# Patient Record
Sex: Male | Born: 1970 | Hispanic: No | Smoking: Current every day smoker
Health system: Southern US, Community
[De-identification: ages and names within clinical notes are randomized; demographics above are authoritative.]

## PROBLEM LIST (undated history)

## (undated) DIAGNOSIS — A4902 Methicillin resistant Staphylococcus aureus infection, unspecified site: Secondary | ICD-10-CM

---

## 2010-03-14 ENCOUNTER — Emergency Department (HOSPITAL_COMMUNITY)
Admission: EM | Admit: 2010-03-14 | Discharge: 2010-03-14 | Payer: Self-pay | Source: Home / Self Care | Admitting: Emergency Medicine

## 2010-06-04 ENCOUNTER — Emergency Department (HOSPITAL_COMMUNITY): Payer: Worker's Compensation

## 2010-06-04 ENCOUNTER — Emergency Department (HOSPITAL_COMMUNITY)
Admission: EM | Admit: 2010-06-04 | Discharge: 2010-06-04 | Disposition: A | Discharge status: Home / Self Care | Payer: Worker's Compensation | Attending: Emergency Medicine | Admitting: Emergency Medicine

## 2010-06-04 DIAGNOSIS — S0990XA Unspecified injury of head, initial encounter: Secondary | ICD-10-CM | POA: Insufficient documentation

## 2010-06-04 DIAGNOSIS — W208XXA Other cause of strike by thrown, projected or falling object, initial encounter: Secondary | ICD-10-CM | POA: Insufficient documentation

## 2010-06-04 DIAGNOSIS — Y990 Civilian activity done for income or pay: Secondary | ICD-10-CM | POA: Insufficient documentation

## 2010-06-04 DIAGNOSIS — R51 Headache: Secondary | ICD-10-CM | POA: Insufficient documentation

## 2010-06-04 DIAGNOSIS — M542 Cervicalgia: Secondary | ICD-10-CM | POA: Insufficient documentation

## 2011-04-16 ENCOUNTER — Emergency Department (HOSPITAL_COMMUNITY): Payer: Self-pay

## 2011-04-16 ENCOUNTER — Emergency Department (HOSPITAL_COMMUNITY)
Admission: EM | Admit: 2011-04-16 | Discharge: 2011-04-16 | Disposition: A | Discharge status: Home / Self Care | Payer: Self-pay | Attending: Emergency Medicine | Admitting: Emergency Medicine

## 2011-04-16 ENCOUNTER — Encounter (HOSPITAL_COMMUNITY): Payer: Self-pay | Admitting: Emergency Medicine

## 2011-04-16 DIAGNOSIS — M109 Gout, unspecified: Secondary | ICD-10-CM | POA: Insufficient documentation

## 2011-04-16 DIAGNOSIS — M7989 Other specified soft tissue disorders: Secondary | ICD-10-CM | POA: Insufficient documentation

## 2011-04-16 DIAGNOSIS — M79609 Pain in unspecified limb: Secondary | ICD-10-CM | POA: Insufficient documentation

## 2011-04-16 MED ORDER — INDOMETHACIN 25 MG PO CAPS: 25.0000 mg | ORAL_CAPSULE | Freq: Three times a day (TID) | ORAL | Status: DC | PRN

## 2011-04-16 MED ORDER — OXYCODONE-ACETAMINOPHEN 5-325 MG PO TABS: 1.0000 | ORAL_TABLET | Freq: Four times a day (QID) | ORAL | Status: AC | PRN

## 2011-04-16 MED ORDER — OXYCODONE-ACETAMINOPHEN 5-325 MG PO TABS: 1.0000 | ORAL_TABLET | Freq: Four times a day (QID) | ORAL | Status: DC | PRN

## 2011-04-16 MED ORDER — KETOROLAC TROMETHAMINE 60 MG/2ML IM SOLN
60.0000 mg | Freq: Once | INTRAMUSCULAR | Status: AC
  Administered 2011-04-16: 60 mg via INTRAMUSCULAR
  Filled 2011-04-16: qty 2

## 2011-04-16 NOTE — ED Notes (Signed)
Middle Finger of l/hand increased in swelling and tenderness over 3 days

## 2011-04-16 NOTE — Discharge Instructions (Signed)
Gout Gout is an inflammatory condition (arthritis) caused by a buildup of uric acid crystals in the joints. Uric acid is a chemical that is normally present in the blood. Under some circumstances, uric acid can form into crystals in your joints. This causes joint redness, soreness, and swelling (inflammation). Repeat attacks are common. Over time, uric acid crystals can form into masses (tophi) near a joint, causing disfigurement. Gout is treatable and often preventable. CAUSES  The disease begins with elevated levels of uric acid in the blood. Uric acid is produced by your body when it breaks down a naturally found substance called purines. This also happens when you eat certain foods such as meats and fish. Causes of an elevated uric acid level include:  Being passed down from parent to child (heredity).   Diseases that cause increased uric acid production (obesity, psoriasis, some cancers).   Excessive alcohol use.   Diet, especially diets rich in meat and seafood.   Medicines, including certain cancer-fighting drugs (chemotherapy), diuretics, and aspirin.   Chronic kidney disease. The kidneys are no longer able to remove uric acid well.   Problems with metabolism.  Conditions strongly associated with gout include:  Obesity.   High blood pressure.   High cholesterol.   Diabetes.  Not everyone with elevated uric acid levels gets gout. It is not understood why some people get gout and others do not. Surgery, joint injury, and eating too much of certain foods are some of the factors that can lead to gout. SYMPTOMS   An attack of gout comes on quickly. It causes intense pain with redness, swelling, and warmth in a joint.   Fever can occur.   Often, only one joint is involved. Certain joints are more commonly involved:   Base of the big toe.   Knee.   Ankle.   Wrist.   Finger.  Without treatment, an attack usually goes away in a few days to weeks. Between attacks, you  usually will not have symptoms, which is different from many other forms of arthritis. DIAGNOSIS  Your caregiver will suspect gout based on your symptoms and exam. Removal of fluid from the joint (arthrocentesis) is done to check for uric acid crystals. Your caregiver will give you a medicine that numbs the area (local anesthetic) and use a needle to remove joint fluid for exam. Gout is confirmed when uric acid crystals are seen in joint fluid, using a special microscope. Sometimes, blood, urine, and X-ray tests are also used. TREATMENT  There are 2 phases to gout treatment: treating the sudden onset (acute) attack and preventing attacks (prophylaxis). Treatment of an Acute Attack  Medicines are used. These include anti-inflammatory medicines or steroid medicines.   An injection of steroid medicine into the affected joint is sometimes necessary.   The painful joint is rested. Movement can worsen the arthritis.   You may use warm or cold treatments on painful joints, depending which works best for you.   Discuss the use of coffee, vitamin C, or cherries with your caregiver. These may be helpful treatment options.  Treatment to Prevent Attacks After the acute attack subsides, your caregiver may advise prophylactic medicine. These medicines either help your kidneys eliminate uric acid from your body or decrease your uric acid production. You may need to stay on these medicines for a very long time. The early phase of treatment with prophylactic medicine can be associated with an increase in acute gout attacks. For this reason, during the first few months   of treatment, your caregiver may also advise you to take medicines usually used for acute gout treatment. Be sure you understand your caregiver's directions. You should also discuss dietary treatment with your caregiver. Certain foods such as meats and fish can increase uric acid levels. Other foods such as dairy can decrease levels. Your caregiver  can give you a list of foods to avoid. HOME CARE INSTRUCTIONS   Do not take aspirin to relieve pain. This raises uric acid levels.   Only take over-the-counter or prescription medicines for pain, discomfort, or fever as directed by your caregiver.   Rest the joint as much as possible. When in bed, keep sheets and blankets off painful areas.   Keep the affected joint raised (elevated).   Use crutches if the painful joint is in your leg.   Drink enough water and fluids to keep your urine clear or pale yellow. This helps your body get rid of uric acid. Do not drink alcoholic beverages. They slow the passage of uric acid.   Follow your caregiver's dietary instructions. Pay careful attention to the amount of protein you eat. Your daily diet should emphasize fruits, vegetables, whole grains, and fat-free or low-fat milk products.   Maintain a healthy body weight.  SEEK MEDICAL CARE IF:   You have an oral temperature above 102 F (38.9 C).   You develop diarrhea, vomiting, or any side effects from medicines.   You do not feel better in 24 hours, or you are getting worse.  SEEK IMMEDIATE MEDICAL CARE IF:   Your joint becomes suddenly more tender and you have:   Chills.   An oral temperature above 102 F (38.9 C), not controlled by medicine.  MAKE SURE YOU:   Understand these instructions.   Will watch your condition.   Will get help right away if you are not doing well or get worse.  Document Released: 01/30/2000 Document Revised: 10/14/2010 Document Reviewed: 05/12/2009 [employer] Patient Information 2012 [employer]. 

## 2011-04-16 NOTE — ED Provider Notes (Signed)
History     CSN: [id number]  Arrival date & time 04/16/11  1005   First MD Initiated Contact with Patient 04/16/11 1031      Chief Complaint  Patient presents with  . Hand Pain    l/hand-middle finger painful and swollen x 3 days. Occ. itching. denies trauma    (Consider location/radiation/quality/duration/timing/severity/associated sxs/prior treatment) HPI  History presents to the emergency department with complaints of his knuckle on the middle finger being painful. He denies injury, denies history of gout, denies history of the same. He states that it started 3 days ago and progressively got worse. He denies having pain like this before in his life.pt denies having significant PMH  or fevers, vomiting, weakness.   History reviewed. No pertinent past medical history.  History reviewed. No pertinent past surgical history.  Family History  Problem Relation Age of Onset  . Diabetes Mother   . Hypertension Mother     History  Substance Use Topics  . Smoking status: Current Everyday Smoker -- 15 years    Types: Cigarettes  . Smokeless tobacco: Not on file  . Alcohol Use: Yes      Review of Systems  All other systems reviewed and are negative.    Allergies  Review of patient's allergies indicates no known allergies.  Home Medications   Current Outpatient Rx  Name Route Sig Dispense Refill  . INDOMETHACIN 25 MG PO CAPS Oral Take 1 capsule (25 mg total) by mouth 3 (three) times daily as needed. 30 capsule 0  . OXYCODONE-ACETAMINOPHEN 5-325 MG PO TABS Oral Take 1 tablet by mouth every 6 (six) hours as needed for pain. 15 tablet 0    BP 142/86  Pulse 70  Temp(Src) 98.3 F (36.8 C) (Oral)  Resp 16  Wt 200 lb (90.719 kg)  SpO2 98%  Physical Exam  Nursing note and vitals reviewed. Constitutional: He appears well-developed and well-nourished. No distress.  HENT:  Head: Normocephalic and atraumatic.  Eyes: Pupils are equal, round, and reactive to light.  Neck:  Normal range of motion. Neck supple.  Cardiovascular: Normal rate and regular rhythm.   Pulmonary/Chest: Effort normal.  Abdominal: Soft.  Musculoskeletal:       Left hand: He exhibits tenderness. He exhibits normal range of motion, no bony tenderness, normal two-point discrimination, normal capillary refill, no deformity, no laceration and no swelling. normal sensation noted. Normal strength noted.       Hands: Neurological: He is alert.  Skin: Skin is warm and dry.    ED Course  Procedures (including critical care time)  Labs Reviewed - No data to display Dg Hand Complete Left  04/16/2011  *RADIOLOGY REPORT*  Clinical Data: Hand pain.  Pain predominantly involves the distal third finger.  LEFT HAND - COMPLETE 3+ VIEW  Comparison: None.  Findings: Imaged bones, joints and soft tissues appear normal.  IMPRESSION: Negative exam.  Original Report Authenticated By: Bernadene Bell. Maricela Curet, M.D.     1. Gout       MDM  Pt has no history of gout, however his symptoms are consistent for gout. Will treat with Percocet and Indomethacin and refer patient to his PCP.         Dorthula Matas, PA 04/16/11 1125

## 2011-04-18 NOTE — ED Provider Notes (Signed)
Medical screening examination/treatment/procedure(s) were performed by non-physician practitioner and as supervising physician I was immediately available for consultation/collaboration.   Hurman Horn, MD 04/18/11 [phone]

## 2011-04-19 ENCOUNTER — Emergency Department (HOSPITAL_COMMUNITY)
Admission: EM | Admit: 2011-04-19 | Discharge: 2011-04-19 | Disposition: A | Discharge status: Home / Self Care | Payer: Worker's Compensation | Attending: Emergency Medicine | Admitting: Emergency Medicine

## 2011-04-19 ENCOUNTER — Ambulatory Visit (HOSPITAL_BASED_OUTPATIENT_CLINIC_OR_DEPARTMENT_OTHER): Payer: Worker's Compensation | Admitting: Anesthesiology

## 2011-04-19 ENCOUNTER — Emergency Department (HOSPITAL_COMMUNITY): Payer: Worker's Compensation

## 2011-04-19 ENCOUNTER — Encounter (HOSPITAL_BASED_OUTPATIENT_CLINIC_OR_DEPARTMENT_OTHER): Payer: Self-pay | Admitting: Orthopedic Surgery

## 2011-04-19 ENCOUNTER — Encounter (HOSPITAL_BASED_OUTPATIENT_CLINIC_OR_DEPARTMENT_OTHER)
Admission: RE | Disposition: A | Discharge status: Home / Self Care | Payer: Self-pay | Source: Ambulatory Visit | Attending: Orthopedic Surgery

## 2011-04-19 ENCOUNTER — Encounter (HOSPITAL_BASED_OUTPATIENT_CLINIC_OR_DEPARTMENT_OTHER): Payer: Self-pay | Admitting: Anesthesiology

## 2011-04-19 ENCOUNTER — Encounter (HOSPITAL_BASED_OUTPATIENT_CLINIC_OR_DEPARTMENT_OTHER): Payer: Self-pay

## 2011-04-19 ENCOUNTER — Encounter (HOSPITAL_COMMUNITY): Payer: Self-pay | Admitting: *Deleted

## 2011-04-19 ENCOUNTER — Ambulatory Visit (HOSPITAL_BASED_OUTPATIENT_CLINIC_OR_DEPARTMENT_OTHER)
Admission: RE | Admit: 2011-04-19 | Discharge: 2011-04-19 | Disposition: A | Discharge status: Home / Self Care | Payer: Worker's Compensation | Source: Ambulatory Visit | Attending: Orthopedic Surgery | Admitting: Orthopedic Surgery

## 2011-04-19 DIAGNOSIS — L089 Local infection of the skin and subcutaneous tissue, unspecified: Secondary | ICD-10-CM | POA: Insufficient documentation

## 2011-04-19 DIAGNOSIS — IMO0002 Reserved for concepts with insufficient information to code with codable children: Secondary | ICD-10-CM

## 2011-04-19 DIAGNOSIS — M7989 Other specified soft tissue disorders: Secondary | ICD-10-CM | POA: Insufficient documentation

## 2011-04-19 HISTORY — PX: I & D EXTREMITY: SHX5045

## 2011-04-19 LAB — DIFFERENTIAL
Basophils Absolute: 0 10*3/uL (ref 0.0–0.1)
Basophils Relative: 0 % (ref 0–1)
Eosinophils Absolute: 0.1 10*3/uL (ref 0.0–0.7)
Eosinophils Relative: 1 % (ref 0–5)
Monocytes Absolute: 0.7 10*3/uL (ref 0.1–1.0)
Neutro Abs: 6.4 10*3/uL (ref 1.7–7.7)

## 2011-04-19 LAB — SEDIMENTATION RATE: Sed Rate: 26 mm/hr — ABNORMAL HIGH (ref 0–16)

## 2011-04-19 LAB — CBC
HCT: 41.9 % (ref 39.0–52.0)
MCH: 29.9 pg (ref 26.0–34.0)
MCHC: 35.1 g/dL (ref 30.0–36.0)
RDW: 14.1 % (ref 11.5–15.5)

## 2011-04-19 SURGERY — IRRIGATION AND DEBRIDEMENT EXTREMITY
Anesthesia: General | Site: Finger | Laterality: Left | Wound class: Dirty or Infected

## 2011-04-19 MED ORDER — METOCLOPRAMIDE HCL 5 MG/ML IJ SOLN: 10.0000 mg | Freq: Once | INTRAMUSCULAR | Status: DC | PRN

## 2011-04-19 MED ORDER — ROPIVACAINE HCL 5 MG/ML IJ SOLN
INTRAMUSCULAR | Status: DC | PRN
  Administered 2011-04-19: 30 mL via EPIDURAL

## 2011-04-19 MED ORDER — SODIUM CHLORIDE 0.9 % IV SOLN
3.0000 g | Freq: Once | INTRAVENOUS | Status: AC
  Administered 2011-04-19: 3 g via INTRAVENOUS
  Filled 2011-04-19: qty 3

## 2011-04-19 MED ORDER — LIDOCAINE HCL 1 % IJ SOLN
INTRAMUSCULAR | Status: DC | PRN
  Administered 2011-04-19: 2 mL via INTRADERMAL

## 2011-04-19 MED ORDER — PROPOFOL 10 MG/ML IV EMUL
INTRAVENOUS | Status: DC | PRN
  Administered 2011-04-19: 250 mg via INTRAVENOUS

## 2011-04-19 MED ORDER — FENTANYL CITRATE 0.05 MG/ML IJ SOLN
50.0000 ug | INTRAMUSCULAR | Status: DC | PRN
  Administered 2011-04-19: 100 ug via INTRAVENOUS

## 2011-04-19 MED ORDER — SULFAMETHOXAZOLE-TRIMETHOPRIM 800-160 MG PO TABS: 1.0000 | ORAL_TABLET | Freq: Two times a day (BID) | ORAL | Status: AC

## 2011-04-19 MED ORDER — ONDANSETRON HCL 4 MG/2ML IJ SOLN
INTRAMUSCULAR | Status: DC | PRN
  Administered 2011-04-19: 4 mg via INTRAVENOUS

## 2011-04-19 MED ORDER — FENTANYL CITRATE 0.05 MG/ML IJ SOLN: 25.0000 ug | INTRAMUSCULAR | Status: DC | PRN

## 2011-04-19 MED ORDER — CHLORHEXIDINE GLUCONATE 4 % EX LIQD: 60.0000 mL | Freq: Once | CUTANEOUS | Status: DC

## 2011-04-19 MED ORDER — DEXAMETHASONE SODIUM PHOSPHATE 10 MG/ML IJ SOLN
INTRAMUSCULAR | Status: DC | PRN
  Administered 2011-04-19: 10 mg via INTRAVENOUS

## 2011-04-19 MED ORDER — CEFAZOLIN SODIUM 1-5 GM-% IV SOLN
INTRAVENOUS | Status: DC | PRN
  Administered 2011-04-19: 2 g via INTRAVENOUS

## 2011-04-19 MED ORDER — MORPHINE SULFATE 4 MG/ML IJ SOLN
4.0000 mg | INTRAMUSCULAR | Status: DC | PRN
  Administered 2011-04-19: 4 mg via INTRAVENOUS
  Filled 2011-04-19: qty 1

## 2011-04-19 MED ORDER — OXYCODONE-ACETAMINOPHEN 5-325 MG PO TABS: ORAL_TABLET | ORAL | Status: AC

## 2011-04-19 MED ORDER — MIDAZOLAM HCL 2 MG/2ML IJ SOLN
0.5000 mg | INTRAMUSCULAR | Status: DC | PRN
  Administered 2011-04-19: 2 mg via INTRAVENOUS

## 2011-04-19 MED ORDER — LACTATED RINGERS IV SOLN
INTRAVENOUS | Status: DC
  Administered 2011-04-19 (×2): via INTRAVENOUS

## 2011-04-19 SURGICAL SUPPLY — 51 items
BAG DECANTER FOR FLEXI CONT (MISCELLANEOUS) IMPLANT
BANDAGE ELASTIC 3 VELCRO ST LF (GAUZE/BANDAGES/DRESSINGS) IMPLANT
BANDAGE GAUZE ELAST BULKY 4 IN (GAUZE/BANDAGES/DRESSINGS) IMPLANT
BANDAGE GAUZE STRT 1 STR LF (GAUZE/BANDAGES/DRESSINGS) IMPLANT
BLADE MINI RND TIP GREEN BEAV (BLADE) IMPLANT
BLADE SURG 15 STRL LF DISP TIS (BLADE) ×2 IMPLANT
BLADE SURG 15 STRL SS (BLADE) ×2
BNDG COHESIVE 1X5 TAN STRL LF (GAUZE/BANDAGES/DRESSINGS) ×2 IMPLANT
BNDG ELASTIC 2 VLCR STRL LF (GAUZE/BANDAGES/DRESSINGS) IMPLANT
BNDG ESMARK 4X9 LF (GAUZE/BANDAGES/DRESSINGS) ×2 IMPLANT
CHLORAPREP W/TINT 26ML (MISCELLANEOUS) ×2 IMPLANT
CLOTH BEACON ORANGE TIMEOUT ST (SAFETY) ×2 IMPLANT
CORDS BIPOLAR (ELECTRODE) ×2 IMPLANT
COVER MAYO STAND STRL (DRAPES) ×2 IMPLANT
COVER TABLE BACK 60X90 (DRAPES) ×2 IMPLANT
CUFF TOURNIQUET SINGLE 18IN (TOURNIQUET CUFF) ×2 IMPLANT
DRAPE EXTREMITY T 121X128X90 (DRAPE) ×2 IMPLANT
DRAPE SURG 17X23 STRL (DRAPES) ×2 IMPLANT
GAUZE PACKING IODOFORM 1/4X5 (PACKING) ×2 IMPLANT
GAUZE SPONGE 4X4 12PLY STRL LF (GAUZE/BANDAGES/DRESSINGS) ×4 IMPLANT
GAUZE XEROFORM 1X8 LF (GAUZE/BANDAGES/DRESSINGS) ×2 IMPLANT
GLOVE BIO SURGEON STRL SZ7.5 (GLOVE) ×2 IMPLANT
GLOVE BIOGEL PI IND STRL 8 (GLOVE) ×2 IMPLANT
GLOVE BIOGEL PI INDICATOR 8 (GLOVE) ×2
GLOVE SURG SS PI 8.0 STRL IVOR (GLOVE) ×2 IMPLANT
GOWN PREVENTION PLUS XLARGE (GOWN DISPOSABLE) IMPLANT
GOWN PREVENTION PLUS XXLARGE (GOWN DISPOSABLE) ×2 IMPLANT
GOWN STRL REIN XL XLG (GOWN DISPOSABLE) ×2 IMPLANT
LOOP VESSEL MAXI BLUE (MISCELLANEOUS) IMPLANT
NEEDLE HYPO 25X1 1.5 SAFETY (NEEDLE) IMPLANT
NS IRRIG 1000ML POUR BTL (IV SOLUTION) ×2 IMPLANT
PACK BASIN DAY SURGERY FS (CUSTOM PROCEDURE TRAY) ×2 IMPLANT
PAD CAST 3X4 CTTN HI CHSV (CAST SUPPLIES) IMPLANT
PADDING CAST ABS 4INX4YD NS (CAST SUPPLIES) ×1
PADDING CAST ABS COTTON 4X4 ST (CAST SUPPLIES) ×1 IMPLANT
PADDING CAST COTTON 3X4 STRL (CAST SUPPLIES)
SPLINT FNGR PLAIN END 5/8X3.25 (CAST SUPPLIES) ×1 IMPLANT
SPLINT PLASTALUME 3 1/4 (CAST SUPPLIES) ×2
SPLINT PLASTER CAST XFAST 3X15 (CAST SUPPLIES) IMPLANT
SPLINT PLASTER XTRA FASTSET 3X (CAST SUPPLIES)
SPONGE GAUZE 4X4 12PLY (GAUZE/BANDAGES/DRESSINGS) IMPLANT
STOCKINETTE 4X48 STRL (DRAPES) ×2 IMPLANT
SUT ETHILON 4 0 PS 2 18 (SUTURE) ×2 IMPLANT
SWAB CULTURE LIQ STUART DBL (MISCELLANEOUS) ×2 IMPLANT
SYR BULB 3OZ (MISCELLANEOUS) ×2 IMPLANT
SYR CONTROL 10ML LL (SYRINGE) ×2 IMPLANT
TOWEL OR 17X24 6PK STRL BLUE (TOWEL DISPOSABLE) ×4 IMPLANT
TUBE ANAEROBIC SPECIMEN COL (MISCELLANEOUS) ×2 IMPLANT
TUBE FEEDING 5FR 15 INCH (TUBING) IMPLANT
UNDERPAD 30X30 INCONTINENT (UNDERPADS AND DIAPERS) ×2 IMPLANT
WATER STERILE IRR 1000ML POUR (IV SOLUTION) IMPLANT

## 2011-04-19 NOTE — Discharge Instructions (Addendum)
[facility] Instructions Hand Surgery  Wound Care: Keep your hand elevated above the level of your heart.  Do not allow it to dangle  by your side.  Keep the dressing dry and do not remove it unless your doctor advises you to do so.  He will usually change it at the time of your post-op visit.  Moving your fingers is advised to stimulate circulation but will depend on the site of your surgery.  If you have a splint applied, your doctor will advise you regarding movement.  Activity: Do not drive or operate machinery today.  Rest today and then you may return to your normal activity and work as indicated by your physician.  Diet:  Drink liquids today or eat a light diet.  You may resume a regular diet tomorrow.    General expectations: Pain for two to three days. Fingers may become slightly swollen.  Call your doctor if any of the following occur: Severe pain not relieved by pain medication. Elevated temperature. Dressing soaked with blood. Inability to move fingers. White or bluish color to fingers.   Regional Anesthesia Blocks  1. Numbness or the inability to move the "blocked" extremity may last from 3-48 hours after placement. The length of time depends on the medication injected and your individual response to the medication. If the numbness is not going away after 48 hours, call your surgeon.  2. The extremity that is blocked will need to be protected until the numbness is gone and the  Strength has returned. Because you cannot feel it, you will need to take extra care to avoid injury. Because it may be weak, you may have difficulty moving it or using it. You may not know what position it is in without looking at it while the block is in effect.  3. For blocks in the legs and feet, returning to weight bearing and walking needs to be done carefully. You will need to wait until the numbness is entirely gone and the strength has returned. You should be able to move your leg and foot  normally before you try and bear weight or walk. You will need someone to be with you when you first try to ensure you do not fall and possibly risk injury.  4. Bruising and tenderness at the needle site are common side effects and will resolve in a few days.  5. Persistent numbness or new problems with movement should be communicated to the surgeon or the [facility] ([phone]).    Post Anesthesia Home Care Instructions  Activity: Get plenty of rest for the remainder of the day. A responsible adult should stay with you for 24 hours following the procedure.  For the next 24 hours, DO NOT: -Drive a car -Operate machinery -Drink alcoholic beverages -Take any medication unless instructed by your physician -Make any legal decisions or sign important papers.  Meals: Start with liquid foods such as gelatin or soup. Progress to regular foods as tolerated. Avoid greasy, spicy, heavy foods. If nausea and/or vomiting occur, drink only clear liquids until the nausea and/or vomiting subsides. Call your physician if vomiting continues.  Special Instructions/Symptoms: Your throat may feel dry or sore from the anesthesia or the breathing tube placed in your throat during surgery. If this causes discomfort, gargle with warm salt water. The discomfort should disappear within 24 hours.    

## 2011-04-19 NOTE — Op Note (Signed)
Dictation [id number]

## 2011-04-19 NOTE — Anesthesia Preprocedure Evaluation (Signed)
Anesthesia Evaluation  Patient identified by MRN, date of birth, ID band Patient awake    Reviewed: Allergy & Precautions, H&P , NPO status , Patient's Chart, lab work & pertinent test results, reviewed documented beta blocker date and time   Airway Mallampati: II TM Distance: >3 FB Neck ROM: full    Dental   Pulmonary Current Smoker,          Cardiovascular negative cardio ROS      Neuro/Psych negative neurological ROS  negative psych ROS   GI/Hepatic negative GI ROS, Neg liver ROS,   Endo/Other  negative endocrine ROS  Renal/GU negative Renal ROS  negative genitourinary   Musculoskeletal   Abdominal   Peds  Hematology negative hematology ROS (+)   Anesthesia Other Findings See surgeon's H&P   Reproductive/Obstetrics negative OB ROS                           Anesthesia Physical Anesthesia Plan  ASA: II  Anesthesia Plan: General   Post-op Pain Management:    Induction: Intravenous  Airway Management Planned: LMA  Additional Equipment:   Intra-op Plan:   Post-operative Plan: Extubation in OR  Informed Consent: I have reviewed the patients History and Physical, chart, labs and discussed the procedure including the risks, benefits and alternatives for the proposed anesthesia with the patient or authorized representative who has indicated his/her understanding and acceptance.     Plan Discussed with: CRNA and Surgeon  Anesthesia Plan Comments:         Anesthesia Quick Evaluation

## 2011-04-19 NOTE — ED Provider Notes (Signed)
History     CSN: [id number]  Arrival date & time 04/19/11  0422   First MD Initiated Contact with Patient 04/19/11 [phone]      Chief Complaint  Patient presents with  . Gout    L 3rd digit  . Joint Swelling    (Consider location/radiation/quality/duration/timing/severity/associated sxs/prior treatment) HPI Comments: Patient presents emergency Department with a chief complaint of finger pain of left 3rd digit. Pt states that pain started on wed evening and continued to gradually worsen. Pt was evaluated in ED & dx w first time gout. Pt was given indomethacin & pain meds w advice to return to ED if no improvement. Pt states that his joint swelling has worsened, discoloration & warmth are spreading and that he cannot move his finger at all. Pt denies IV drug use, recent cuts or trauma to finger. 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 Pt denies pain of other fingers, wrist or elbow, fevers, night sweats or chills.  The history is provided by the patient.    History reviewed. No pertinent past medical history.  History reviewed. No pertinent past surgical history.  Family History  Problem Relation Age of Onset  . Diabetes Mother   . Hypertension Mother     History  Substance Use Topics  . Smoking status: Current Everyday Smoker -- 15 years    Types: Cigarettes  . Smokeless tobacco: Not on file  . Alcohol Use: Yes     weekends      Review of Systems  Constitutional: Negative for fever, chills and appetite change.  HENT: Negative for congestion.   Eyes: Negative for visual disturbance.  Respiratory: Negative for shortness of breath.   Cardiovascular: Negative for chest pain and leg swelling.  Gastrointestinal: Negative for abdominal pain.  Genitourinary: Negative for dysuria, urgency and frequency.  Musculoskeletal: Positive for joint swelling.  Skin: Positive for color change.  Neurological: Negative for dizziness, syncope, weakness, light-headedness, numbness and  headaches.  Psychiatric/Behavioral: Negative for confusion.  All other systems reviewed and are negative.    Allergies  Review of patient's allergies indicates no known allergies.  Home Medications   Current Outpatient Rx  Name Route Sig Dispense Refill  . INDOMETHACIN 25 MG PO CAPS Oral Take 1 capsule (25 mg total) by mouth 3 (three) times daily as needed. 30 capsule 0  . OXYCODONE-ACETAMINOPHEN 5-325 MG PO TABS Oral Take 1 tablet by mouth every 6 (six) hours as needed for pain. 15 tablet 0    BP 141/94  Pulse 65  Temp(Src) 97.7 F (36.5 C) (Oral)  Resp 18  SpO2 98%  Physical Exam  Nursing note and vitals reviewed. Constitutional: He is oriented to person, place, and time. He appears well-developed and well-nourished. No distress.  HENT:  Head: Normocephalic and atraumatic.  Eyes: Conjunctivae and EOM are normal.  Neck: Normal range of motion.  Cardiovascular: Normal rate, regular rhythm and intact distal pulses.   Pulmonary/Chest: Effort normal.  Musculoskeletal: Normal range of motion.       Warmth & erythema of L 3rd digit expanding from DIP to PIP of palmer surface and spreading to dorsal surface. Inability of active or passive ROM of DIP & PIP.  No bony ttp of wrist or elbow. No red streaks up arm.    Neurological: He is alert and oriented to person, place, and time.  Skin: Skin is warm and dry. No rash noted. He is not diaphoretic.  Psychiatric: He has a normal mood and affect. His  behavior is normal.    ED Course  Procedures (including critical care time)  Labs Reviewed - No data to display No results found.   No diagnosis found.    MDM  Finger infection  Discussed case w Dr. Hyacinth Meeker who agrees that the inflammation has spread out side the joint space and today appears to be an infection rather then gout. Hand specialist has been involved and the pt is to be seen by him today as an OP for possible surgery after dose ov IV abx given. Repeat Xray and labs  pending.         Jaci Carrel, [place] 04/19/11 [phone]

## 2011-04-19 NOTE — Brief Op Note (Signed)
04/19/2011  11:23 AM  PATIENT:  Crosby Genoveva Ill  41 y.o. male  PRE-OPERATIVE DIAGNOSIS:  left long finger infection  POST-OPERATIVE DIAGNOSIS:  left long finger infection  PROCEDURE:  Procedure(s) (LRB): IRRIGATION AND DEBRIDEMENT EXTREMITY (Left)  SURGEON:  Surgeon(s) and Role:    * Tami Ribas, MD - Primary  PHYSICIAN ASSISTANT:   ASSISTANTS: none   ANESTHESIA:   regional and general  EBL:  Total I/O In: 1000 [I.V.:1000] Out: -   BLOOD ADMINISTERED:none  DRAINS: iodoform packing  LOCAL MEDICATIONS USED:  NONE  SPECIMEN:  Source of Specimen:  left long finger  DISPOSITION OF SPECIMEN:  micro  COUNTS:  YES  TOURNIQUET:   Total Tourniquet Time Documented: Forearm (Left) - 30 minutes  DICTATION: .Other Dictation: Dictation Number [id number]  PLAN OF CARE: Discharge to home after PACU  PATIENT DISPOSITION:  PACU - hemodynamically stable.

## 2011-04-19 NOTE — Discharge Instructions (Signed)
Please go to the [facility] near the [place] [facility] on [address] at [address].  Dr. Merlyn Lot will need to care for a possible surgical procedure.

## 2011-04-19 NOTE — H&P (Signed)
Roy Lowe is an 41 y.o. male.   Chief Complaint: left long finger infection HPI: 41 yo rhd male noted itching sensation in left long finger6 days ago.  Began to become painful and presented to [facility] 3 days ago.  Diagnosed with possible gout and started on indomethacin.  Has had no relief with this.  Continued pain in finger.  Sweats last night.  Doesn't remember any wounds or injuries to finger.  No previous problems like this and no others at this time.  No history of herpes infection.  No past medical history on file.  No past surgical history on file.  Family History  Problem Relation Age of Onset  . Diabetes Mother   . Hypertension Mother    Social History:  reports that he has been smoking Cigarettes.  He has smoked for the past 15 years. He does not have any smokeless tobacco history on file. He reports that he drinks alcohol. He reports that he does not use illicit drugs.  Allergies: No Known Allergies  Medications Prior to Admission  Medication Dose Route Frequency Provider Last Rate Last Dose  . Ampicillin-Sulbactam (UNASYN) 3 g in sodium chloride 0.9 % 100 mL IVPB  3 g Intravenous Once Lisette Paz, PA-C   3 g at 04/19/11 0542  . DISCONTD: morphine 4 MG/ML injection 4 mg  4 mg Intravenous PRN Lisette Paz, PA-C   4 mg at 04/19/11 0542   Medications Prior to Admission  Medication Sig Dispense Refill  . indomethacin (INDOCIN) 25 MG capsule Take 1 capsule (25 mg total) by mouth 3 (three) times daily as needed.  30 capsule  0  . oxyCODONE-acetaminophen (PERCOCET) 5-325 MG per tablet Take 1 tablet by mouth every 6 (six) hours as needed for pain.  15 tablet  0    Results for orders placed during the hospital encounter of 04/19/11 (from the past 48 hour(s))  SEDIMENTATION RATE     Status: Abnormal   Collection Time   04/19/11  5:27 AM      Component Value Range Comment   Sed Rate 26 (*) 0 - 16 (mm/hr)   CBC     Status: Normal   Collection Time   04/19/11  5:27 AM   Component Value Range Comment   WBC 9.0  4.0 - 10.5 (K/uL)    RBC 4.91  4.22 - 5.81 (MIL/uL)    Hemoglobin 14.7  13.0 - 17.0 (g/dL)    HCT 40.9  81.1 - 91.4 (%)    MCV 85.3  78.0 - 100.0 (fL)    MCH 29.9  26.0 - 34.0 (pg)    MCHC 35.1  30.0 - 36.0 (g/dL)    RDW 78.2  95.6 - 21.3 (%)    Platelets 275  150 - 400 (K/uL)   DIFFERENTIAL     Status: Normal   Collection Time   04/19/11  5:27 AM      Component Value Range Comment   Neutrophils Relative 71  43 - 77 (%)    Neutro Abs 6.4  1.7 - 7.7 (K/uL)    Lymphocytes Relative 20  12 - 46 (%)    Lymphs Abs 1.8  0.7 - 4.0 (K/uL)    Monocytes Relative 8  3 - 12 (%)    Monocytes Absolute 0.7  0.1 - 1.0 (K/uL)    Eosinophils Relative 1  0 - 5 (%)    Eosinophils Absolute 0.1  0.0 - 0.7 (K/uL)    Basophils Relative 0  0 - 1 (%)    Basophils Absolute 0.0  0.0 - 0.1 (K/uL)   URIC ACID     Status: Normal   Collection Time   04/19/11  5:27 AM      Component Value Range Comment   Uric Acid, Serum 5.0  4.0 - 7.8 (mg/dL)     Dg Finger Middle Left  04/19/2011  *RADIOLOGY REPORT*  Clinical Data: Pain and swelling left middle finger, no injury, history gout  LEFT MIDDLE FINGER 2+V  Comparison: Left hand radiographs 04/16/2011  Findings: Soft tissue swelling middle finger. Joint spaces preserved. Osseous mineralization normal. No acute fracture, dislocation or bone destruction.  IMPRESSION: No acute bony abnormalities.  Original Report Authenticated By: Lollie Marrow, M.D.     A comprehensive review of systems was negative except for: Constitutional: positive for night sweats  There were no vitals taken for this visit.  General appearance: alert, cooperative and appears stated age Head: Normocephalic, without obvious abnormality, atraumatic Neck: supple, symmetrical, trachea midline Resp: clear to auscultation bilaterally Cardio: regular rate and rhythm GI: soft, non-tender; bowel sounds normal; no masses,  no organomegaly Extremities: light touch  sensation and capillary refill intact all digits except left long.  +epl/fpl/io.  right hand full rom.  no ttp.  left hand no ttp except long finger.  long finger with slightly decreased sensation.  normal capillary refill.  ttp at left of dip joint both volarly and dorsally, but moreso volarly.  erythema volarly with palpable and visible fluctuance under skin.  no streaking.  erythema to mid middle phalanx.  no tenderness over middle or proximal phalanges.  able to flex pip and mp without pain.  no visible wounds. Pulses: 2+ and symmetric Skin: as above Neurologic: Grossly normal Incision/Wound: As above  Assessment/Plan Left long finger infection.  Discussed nature of condition with patient and family member.  Recommend irrigation and debridement of left long finger in OR.  Likely both volar and dorsal I&D including dip joint.  Risks, benefits, and alternatives of surgery were discussed and the patient agrees with the plan of care.   Renley Banwart R 04/19/2011, 9:13 AM

## 2011-04-19 NOTE — Anesthesia Postprocedure Evaluation (Signed)
Anesthesia Post Note  Patient: Roy Lowe Ill  Procedure(s) Performed: Procedure(s) (LRB): IRRIGATION AND DEBRIDEMENT EXTREMITY (Left)  Anesthesia type: General  Patient location: PACU  Post pain: Pain level controlled  Post assessment: Patient's Cardiovascular Status Stable  Last Vitals:  Filed Vitals:   04/19/11 1215  BP: 129/78  Pulse: 76  Temp:   Resp: 19    Post vital signs: Reviewed and stable  Level of consciousness: alert  Complications: No apparent anesthesia complications

## 2011-04-19 NOTE — ED Notes (Signed)
Pt dx'd w/ gout in finger last week. Pt presents w/ increased pain and swelling.

## 2011-04-19 NOTE — ED Notes (Signed)
MD and PA at bedside.  

## 2011-04-19 NOTE — Transfer of Care (Signed)
Immediate Anesthesia Transfer of Care Note  Patient: Roy Lowe  Procedure(s) Performed: Procedure(s) (LRB): IRRIGATION AND DEBRIDEMENT EXTREMITY (Left)  Patient Location: PACU  Anesthesia Type: GA combined with regional for post-op pain  Level of Consciousness: sedated  Airway & Oxygen Therapy: Patient Spontanous Breathing and Patient connected to face mask oxygen  Post-op Assessment: Report given to PACU RN and Post -op Vital signs reviewed and stable  Post vital signs: Reviewed and stable  Complications: No apparent anesthesia complications

## 2011-04-19 NOTE — Progress Notes (Signed)
Assisted Dr. Frederick with left, ultrasound guided, supraclavicular block. Side rails up, monitors on throughout procedure. See vital signs in flow sheet. Tolerated Procedure well. 

## 2011-04-19 NOTE — ED Provider Notes (Signed)
Medical screening examination/treatment/procedure(s) were conducted as a shared visit with non-physician practitioner(s) and myself.  I personally evaluated the patient during the encounter  41 year old male with a history of left middle finger swelling for the last several days. This is gradually getting worse, associated with swelling of the finger as well as the joint. He was seen in the emergency department a couple of days ago and started on indomethacin but has not had any improvement and in fact has gotten worse. He denies fevers chills nausea or vomiting.  Physical exam:  No acute distress, swelling and tenderness in the left hand extending from the PIP through the end of the finger. There is a bogginess and fullness throughout this area with severe tenderness to palpation. He is unable to move that joint secondary to severe tenderness. There is no pain at the MCP, no redness extending up the hand or the arm. Pulses are normal at the radial artery on the left hand.  Assessment:  Likely infection of the soft tissue compartments of the finger, I have discussed his care with Dr.Kuzma of hand surgery on-call today who has accepted to see the patient at the surgical center at 8:30 in the morning. He has requested a CRP, sedimentation rate, white blood cell count, uric acid, repeat and imaging and antibiotics which have artery been ordered.  Vida Roller, MD 04/19/11 [phone]

## 2011-04-19 NOTE — Anesthesia Procedure Notes (Addendum)
Anesthesia Regional Block:  Supraclavicular block  Pre-Anesthetic Checklist: ,, timeout performed, Correct Patient, Correct Site, Correct Laterality, Correct Procedure, Correct Position, site marked, Risks and benefits discussed,  Surgical consent,  Pre-op evaluation,  At surgeon's request and post-op pain management  Laterality: Left  Prep: chloraprep       Needles:   Needle Type: Other   (Arrow Echogenic)   Needle Length: 9cm  Needle Gauge: 21    Additional Needles:  Procedures: ultrasound guided Supraclavicular block Narrative:  Start time: 04/19/2011 10:00 AM End time: 04/19/2011 10:06 AM Injection made incrementally with aspirations every 5 mL.  Performed by: Personally  Anesthesiologist: C Frederick  Additional Notes: Ultrasound guidance used to: id relevant anatomy, confirm needle position, local anesthetic spread, avoidance of vascular puncture. Picture saved. No complications. Block performed personally by Janetta Hora. Gelene Mink, MD    Supraclavicular block Procedure Name: LMA Insertion Date/Time: 04/19/2011 10:38 AM Performed by: Signa Kell Pre-anesthesia Checklist: Patient identified, Emergency Drugs available, Suction available and Patient being monitored Patient Re-evaluated:Patient Re-evaluated prior to inductionOxygen Delivery Method: Circle System Utilized Preoxygenation: Pre-oxygenation with 100% oxygen Intubation Type: IV induction Ventilation: Mask ventilation without difficulty LMA: LMA inserted LMA Size: 5.0 Number of attempts: 1 Airway Equipment and Method: bite block Placement Confirmation: positive ETCO2 Tube secured with: Tape Dental Injury: Teeth and Oropharynx as per pre-operative assessment

## 2011-04-20 NOTE — Op Note (Signed)
NAMEMarland Kitchen  Lowe, Roy NO.:  [id number]  MEDICAL RECORD NO.:  [id number]  LOCATION:  WA19                         FACILITY:  [facility]  PHYSICIAN:  Betha Loa, MD        DATE OF BIRTH:  April 24, 1970  DATE OF PROCEDURE:  04/19/2011 DATE OF DISCHARGE:  04/19/2011                              OPERATIVE REPORT   PREOPERATIVE DIAGNOSIS:  Left long finger infection.  POSTOPERATIVE DIAGNOSIS:  Left long finger infection.  PROCEDURE:  Irrigation and debridement of left long finger deep abscess and DIP joint.  SURGEON:  Betha Loa, MD.  ASSISTANTS:  None.  ANESTHESIA:  General with regional.  IV FLUIDS:  Per anesthesia flow sheet.  ESTIMATED BLOOD LOSS:  Minimal.  COMPLICATIONS:  None.  SPECIMENS:  Cultures from left long finger to micro.  TOURNIQUET TIME:  30 minutes.  DISPOSITION:  Stable to PACU.  INDICATIONS:  Roy Lowe is a 41 year old right-hand dominant male who 6 days ago noticed an itching feeling of his left long finger.  He presented to the [facility] Emergency Department 3 days ago and was felt to have gout.  He was started on indomethacin.  He has noted no relief in his discomfort.  He returned to the emergency department early this morning.  New radiographs were taken and labs drawn.  He was afebrile with a normal white count.  His radiographs showed no fractures, dislocations, or radiopaque foreign bodies.  He followed up with me at the [facility].  On examination, he had a swollen erythematous finger with the fluctuance volarly.  I recommended to Mr. Yepes going to the operating room for irrigation and debridement of the left long finger.  Risks, benefits, and alternatives of surgery were discussed including the risk of blood loss, infection, damage to nerves, vessels, tendons, ligaments, bone, failure to surgery, need for additional surgery, complications with wound healing, continued pain, continued infection, need for repeat  irrigation and debridement.  He voiced understanding of these risks and elected to proceed.  We discussed that this would involve a volar incision and probably a dorsal incision as well as there was some fluctuance dorsally and would allow visualization of his DIP joint to ensure there was no infection there. He agreed with the plan of care.  OPERATIVE COURSE:  After being identified preoperatively by myself, the patient and I agreed upon procedure and site procedure.  The surgical site was marked.  Risks, benefits, and alternatives of surgery were reviewed and he wished to proceed.  Surgical consent had been signed.  A regional block was performed by anesthesia in preoperative holding.  He was transported to the operating room, placed on the operating table in supine position with left upper extremity on arm board.  General anesthesia was induced by the anesthesiologist.  Left upper extremity was prepped and draped in normal sterile orthopedic fashion.  Surgical pause was performed between surgeons, anesthesia, operating staff, and all were in agreement with the patient procedure and site of procedure. Tourniquet to proximal aspect of the forearm was inflated to 250 mmHg after exsanguination of the hand and forearm with an Esmarch bandage. The finger was examined.  There was fluctuance  volarly.  A Brunner type incision was used at the distal phalanx and onto the midportion of the proximal phalanx.  There was gross purulence.  This was sent to micro for aerobes, anaerobes, AFB fungus.  Some of the epidermis had delaminated and this was debrided.  The abscess continued into the deeper tissues.  The flexor tendon was identified.  The sheath was milked and there was no fluid within it.  It was not felt that this was a flexor sheath infection.  The abscess coursed to the radial side of the long finger and traveled along with the neurovascular bundle.  The radial digital nerve and artery were  identified and were intact.  There was a branch of apparent nerve that coursed medially that went right into the most infected area of the skin.  Care was taken to ensure that the entirety of the abscess was discovered.  It was debrided of purulence and any devitalized tissue.  A Ray-Tec sponge was used. Attention was turned to the dorsum of the finger.  A small hockey-stick incision was made on the dorsum.  This was carried into the subcutaneous tissues by spreading technique.  The extensor tendon was lifted and the DIP joint entered dorsally.  There was no fluid within the joint.  There was no gross purulence in subcutaneous tissues.  The joint was irrigated with 50 mL of sterile saline by Angiocath needle.  The wound was irrigated with sterile saline.  The volar wound was irrigated with 1000 mL of sterile saline.  The wound bed looked clean.  There was some very devitalized tissue at the corner of the Brunner incision.  This was debrided and a single 4-0 nylon suture placed very loosely to keep the skin from retracting back.  The wounds were all packed with quarter-inch iodoform gauze.  They were dressed with sterile Xeroform, 4x4s, and wrapped with a Coban lightly.  A volar Alumafoam splint was placed and wrapped with the Coban as well.  This was placed lightly.  The tourniquet was deflated at 30 minutes.  The operative drapes were broken down.  The patient was awoken from anesthesia safely.  He was transferred back to the stretcher and taken to PACU in stable condition. I will see him back in the office in 2-3 days for postoperative followup and initiation of hydrotherapy or finger soaks.  I will give him Percocet 5/325, 1-2 p.o. q.6 hours p.r.n. pain, dispensed #40 and Bactrim DS 1 p.o. b.i.d. x10 days.     Betha Loa, MD     KK/MEDQ  D:  04/19/2011  T:  04/20/2011  Job:  [id number]

## 2011-04-21 ENCOUNTER — Encounter (HOSPITAL_BASED_OUTPATIENT_CLINIC_OR_DEPARTMENT_OTHER): Payer: Self-pay | Admitting: Orthopedic Surgery

## 2011-04-22 LAB — CULTURE, ROUTINE-ABSCESS: Gram Stain: NONE SEEN

## 2011-04-24 LAB — ANAEROBIC CULTURE: Gram Stain: NONE SEEN

## 2011-05-17 LAB — FUNGUS CULTURE W SMEAR: Fungal Smear: NONE SEEN

## 2011-06-06 LAB — AFB CULTURE WITH SMEAR (NOT AT ARMC): Acid Fast Smear: NONE SEEN

## 2013-02-14 ENCOUNTER — Encounter (HOSPITAL_COMMUNITY): Payer: Self-pay | Admitting: Emergency Medicine

## 2013-02-14 ENCOUNTER — Emergency Department (HOSPITAL_COMMUNITY)
Admission: EM | Admit: 2013-02-14 | Discharge: 2013-02-14 | Disposition: A | Discharge status: Home / Self Care | Payer: Managed Care, Other (non HMO) | Source: Home / Self Care | Attending: Emergency Medicine | Admitting: Emergency Medicine

## 2013-02-14 DIAGNOSIS — J019 Acute sinusitis, unspecified: Secondary | ICD-10-CM

## 2013-02-14 DIAGNOSIS — J111 Influenza due to unidentified influenza virus with other respiratory manifestations: Secondary | ICD-10-CM

## 2013-02-14 MED ORDER — HYDROCOD POLST-CHLORPHEN POLST 10-8 MG/5ML PO LQCR: 5.0000 mL | Freq: Two times a day (BID) | ORAL | Status: DC | PRN

## 2013-02-14 MED ORDER — AMOXICILLIN-POT CLAVULANATE 875-125 MG PO TABS: 1.0000 | ORAL_TABLET | Freq: Two times a day (BID) | ORAL | Status: DC

## 2013-02-14 NOTE — ED Notes (Signed)
Uri: onset Friday of symptoms.  Onset Friday with vomiting, no more vomiting since Friday, fever initially, now facial /nasal soreness.  Non-productive cough.

## 2013-02-14 NOTE — ED Provider Notes (Signed)
  Chief Complaint   Chief Complaint  Patient presents with  . URI    History of Present Illness   Roy Lowe is a 42 year old male who has had a six-day history of nausea, vomiting, diarrhea, sore throat, headache, facial pain, nasal congestion with clear drainage, subjective fever, chills, dry cough, and watery eyes. He has had no specific sick exposures.  Review of Systems   Other than as noted above, the patient denies any of the following symptoms: Systemic:  No fevers, chills, sweats, or myalgias. Eye:  No redness or discharge. ENT:  No ear pain, headache, nasal congestion, drainage, sinus pressure, or sore throat. Neck:  No neck pain, stiffness, or swollen glands. Lungs:  No cough, sputum production, hemoptysis, wheezing, chest tightness, shortness of breath or chest pain. GI:  No abdominal pain, nausea, vomiting or diarrhea.  PMFSH   Past medical history, family history, social history, meds, and allergies were reviewed.  Physical exam   Vital signs:  There were no vitals taken for this visit. General:  Alert and oriented.  In no distress.  Skin warm and dry. Eye:  No conjunctival injection or drainage. Lids were normal. ENT:  TMs and canals were normal, without erythema or inflammation.  Nasal mucosa was clear and uncongested, without drainage.  Mucous membranes were moist.  Pharynx was clear with no exudate or drainage.  There were no oral ulcerations or lesions. Neck:  Supple, no adenopathy, tenderness or mass. Lungs:  No respiratory distress.  Lungs were clear to auscultation, without wheezes, rales or rhonchi.  Breath sounds were clear and equal bilaterally.  Heart:  Regular rhythm, without gallops, murmers or rubs. Skin:  Clear, warm, and dry, without rash or lesions.  Assessment     The primary encounter diagnosis was Influenza-like illness. A diagnosis of Acute sinusitis was also pertinent to this visit.  Plan    1.  Meds:  The following meds were  prescribed:   Discharge Medication List as of 02/14/2013  8:47 AM    START taking these medications   Details  amoxicillin-clavulanate (AUGMENTIN) 875-125 MG per tablet Take 1 tablet by mouth 2 (two) times daily., Starting 02/14/2013, Until Discontinued, Normal    chlorpheniramine-HYDROcodone (TUSSIONEX) 10-8 MG/5ML LQCR Take 5 mLs by mouth every 12 (twelve) hours as needed for cough., Starting 02/14/2013, Until Discontinued, Normal        2.  Patient Education/Counseling:  The patient was given appropriate handouts, self care instructions, and instructed in symptomatic relief.  Instructed to get extra fluids, rest, and use a cool mist vaporizer.   3.  Follow up:  The patient was told to follow up here if no better in 3 to 4 days, or sooner if becoming worse in any way, and given some red flag symptoms such as increasing fever, difficulty breathing, chest pain, or persistent vomiting which would prompt immediate return.  Follow up here as needed.      Reuben Likes, MD 02/14/13 [phone]

## 2013-03-05 ENCOUNTER — Encounter (HOSPITAL_COMMUNITY): Payer: Self-pay | Admitting: Emergency Medicine

## 2013-03-05 ENCOUNTER — Emergency Department (INDEPENDENT_AMBULATORY_CARE_PROVIDER_SITE_OTHER)
Admission: EM | Admit: 2013-03-05 | Discharge: 2013-03-05 | Disposition: A | Discharge status: Home / Self Care | Payer: Managed Care, Other (non HMO) | Source: Home / Self Care

## 2013-03-05 DIAGNOSIS — B9789 Other viral agents as the cause of diseases classified elsewhere: Secondary | ICD-10-CM

## 2013-03-05 DIAGNOSIS — R059 Cough, unspecified: Secondary | ICD-10-CM

## 2013-03-05 DIAGNOSIS — J069 Acute upper respiratory infection, unspecified: Secondary | ICD-10-CM

## 2013-03-05 DIAGNOSIS — R05 Cough: Secondary | ICD-10-CM

## 2013-03-05 DIAGNOSIS — B349 Viral infection, unspecified: Secondary | ICD-10-CM

## 2013-03-05 MED ORDER — ALBUTEROL SULFATE HFA 108 (90 BASE) MCG/ACT IN AERS: INHALATION_SPRAY | RESPIRATORY_TRACT | Status: AC

## 2013-03-05 MED ORDER — GUAIFENESIN-CODEINE 100-10 MG/5ML PO SYRP: 5.0000 mL | ORAL_SOLUTION | ORAL | Status: DC | PRN

## 2013-03-05 NOTE — ED Notes (Signed)
Pt  Reports  Symptoms  Of  Cough  /  Congested      With  Nasal  stuffyness  And  Pressure           Pt  Reports  Body  Aches  And  General malaise            -  Pt  Is  maske d  And  Is  Sitting  Upright on  Exam table  Speaking in  Complete  sentances

## 2013-03-05 NOTE — Discharge Instructions (Signed)
Cough, Adult ° A cough is a reflex that helps clear your throat and airways. It can help heal the body or may be a reaction to an irritated airway. A cough may only last 2 or 3 weeks (acute) or may last more than 8 weeks (chronic).  °CAUSES °Acute cough: °· Viral or bacterial infections. °Chronic cough: °· Infections. °· Allergies. °· Asthma. °· Post-nasal drip. °· Smoking. °· Heartburn or acid reflux. °· Some medicines. °· Chronic lung problems (COPD). °· Cancer. °SYMPTOMS  °· Cough. °· Fever. °· Chest pain. °· Increased breathing rate. °· High-pitched whistling sound when breathing (wheezing). °· Colored mucus that you cough up (sputum). °TREATMENT  °· A bacterial cough may be treated with antibiotic medicine. °· A viral cough must run its course and will not respond to antibiotics. °· Your caregiver may recommend other treatments if you have a chronic cough. °HOME CARE INSTRUCTIONS  °· Only take over-the-counter or prescription medicines for pain, discomfort, or fever as directed by your caregiver. Use cough suppressants only as directed by your caregiver. °· Use a cold steam vaporizer or humidifier in your bedroom or home to help loosen secretions. °· Sleep in a semi-upright position if your cough is worse at night. °· Rest as needed. °· Stop smoking if you smoke. °SEEK IMMEDIATE MEDICAL CARE IF:  °· You have pus in your sputum. °· Your cough starts to worsen. °· You cannot control your cough with suppressants and are losing sleep. °· You begin coughing up blood. °· You have difficulty breathing. °· You develop pain which is getting worse or is uncontrolled with medicine. °· You have a fever. °MAKE SURE YOU:  °· Understand these instructions. °· Will watch your condition. °· Will get help right away if you are not doing well or get worse. °Document Released: 07/31/2010 Document Revised: 04/26/2011 Document Reviewed: 07/31/2010 °ExitCare® Patient Information ©2014 [employer]. ° °Viral Infections °A viral  infection can be caused by different types of viruses. Most viral infections are not serious and resolve on their own. However, some infections may cause severe symptoms and may lead to further complications. °SYMPTOMS °Viruses can frequently cause: °· Minor sore throat. °· Aches and pains. °· Headaches. °· Runny nose. °· Different types of rashes. °· Watery eyes. °· Tiredness. °· Cough. °· Loss of appetite. °· Gastrointestinal infections, resulting in nausea, vomiting, and diarrhea. °These symptoms do not respond to antibiotics because the infection is not caused by bacteria. However, you might catch a bacterial infection following the viral infection. This is sometimes called a "superinfection." Symptoms of such a bacterial infection may include: °· Worsening sore throat with pus and difficulty swallowing. °· Swollen neck glands. °· Chills and a high or persistent fever. °· Severe headache. °· Tenderness over the sinuses. °· Persistent overall ill feeling (malaise), muscle aches, and tiredness (fatigue). °· Persistent cough. °· Yellow, green, or brown mucus production with coughing. °HOME CARE INSTRUCTIONS  °· Only take over-the-counter or prescription medicines for pain, discomfort, diarrhea, or fever as directed by your caregiver. °· Drink enough water and fluids to keep your urine clear or pale yellow. Sports drinks can provide valuable electrolytes, sugars, and hydration. °· Get plenty of rest and maintain proper nutrition. Soups and broths with crackers or rice are fine. °SEEK IMMEDIATE MEDICAL CARE IF:  °· You have severe headaches, shortness of breath, chest pain, neck pain, or an unusual rash. °· You have uncontrolled vomiting, diarrhea, or you are unable to keep down fluids. °· You or   your child has an oral temperature above 102 F (38.9 C), not controlled by medicine.  Your baby is older than 3 months with a rectal temperature of 102 F (38.9 C) or higher.  Your baby is 243 months old or younger with  a rectal temperature of 100.4 F (38 C) or higher. MAKE SURE YOU:   Understand these instructions.  Will watch your condition.  Will get help right away if you are not doing well or get worse. Document Released: 11/11/2004 Document Revised: 04/26/2011 Document Reviewed: 06/08/2010 [facility] Patient Information 2014 [place], [place].  Upper Respiratory Infection, Adult Sudafed PE and Allegra 180 mg An upper respiratory infection (URI) is also sometimes known as the common cold. The upper respiratory tract includes the nose, sinuses, throat, trachea, and bronchi. Bronchi are the airways leading to the lungs. Most people improve within 1 week, but symptoms can last up to 2 weeks. A residual cough may last even longer.  CAUSES Many different viruses can infect the tissues lining the upper respiratory tract. The tissues become irritated and inflamed and often become very moist. Mucus production is also common. A cold is contagious. You can easily spread the virus to others by oral contact. This includes kissing, sharing a glass, coughing, or sneezing. Touching your mouth or nose and then touching a surface, which is then touched by another person, can also spread the virus. SYMPTOMS  Symptoms typically develop 1 to 3 days after you come in contact with a cold virus. Symptoms vary from person to person. They may include:  Runny nose.  Sneezing.  Nasal congestion.  Sinus irritation.  Sore throat.  Loss of voice (laryngitis).  Cough.  Fatigue.  Muscle aches.  Loss of appetite.  Headache.  Low-grade fever. DIAGNOSIS  You might diagnose your own cold based on familiar symptoms, since most people get a cold 2 to 3 times a year. Your caregiver can confirm this based on your exam. Most importantly, your caregiver can check that your symptoms are not due to another disease such as strep throat, sinusitis, pneumonia, asthma, or epiglottitis. Blood tests, throat tests, and X-rays are not  necessary to diagnose a common cold, but they may sometimes be helpful in excluding other more serious diseases. Your caregiver will decide if any further tests are required. RISKS AND COMPLICATIONS  You may be at risk for a more severe case of the common cold if you smoke cigarettes, have chronic heart disease (such as heart failure) or lung disease (such as asthma), or if you have a weakened immune system. The very young and very old are also at risk for more serious infections. Bacterial sinusitis, middle ear infections, and bacterial pneumonia can complicate the common cold. The common cold can worsen asthma and chronic obstructive pulmonary disease (COPD). Sometimes, these complications can require emergency medical care and may be life-threatening. PREVENTION  The best way to protect against getting a cold is to practice good hygiene. Avoid oral or hand contact with people with cold symptoms. Wash your hands often if contact occurs. There is no clear evidence that vitamin C, vitamin E, echinacea, or exercise reduces the chance of developing a cold. However, it is always recommended to get plenty of rest and practice good nutrition. TREATMENT  Treatment is directed at relieving symptoms. There is no cure. Antibiotics are not effective, because the infection is caused by a virus, not by bacteria. Treatment may include:  Increased fluid intake. Sports drinks offer valuable electrolytes, sugars, and fluids.  Breathing heated mist or steam (vaporizer or shower).  Eating chicken soup or other clear broths, and maintaining good nutrition.  Getting plenty of rest.  Using gargles or lozenges for comfort.  Controlling fevers with ibuprofen or acetaminophen as directed by your caregiver.  Increasing usage of your inhaler if you have asthma. Zinc gel and zinc lozenges, taken in the first 24 hours of the common cold, can shorten the duration and lessen the severity of symptoms. Pain medicines may help  with fever, muscle aches, and throat pain. A variety of non-prescription medicines are available to treat congestion and runny nose. Your caregiver can make recommendations and may suggest nasal or lung inhalers for other symptoms.  HOME CARE INSTRUCTIONS   Only take over-the-counter or prescription medicines for pain, discomfort, or fever as directed by your caregiver.  Use a warm mist humidifier or inhale steam from a shower to increase air moisture. This may keep secretions moist and make it easier to breathe.  Drink enough water and fluids to keep your urine clear or pale yellow.  Rest as needed.  Return to work when your temperature has returned to normal or as your caregiver advises. You may need to stay home longer to avoid infecting others. You can also use a face mask and careful hand washing to prevent spread of the virus. SEEK MEDICAL CARE IF:   After the first few days, you feel you are getting worse rather than better.  You need your caregiver's advice about medicines to control symptoms.  You develop chills, worsening shortness of breath, or brown or red sputum. These may be signs of pneumonia.  You develop yellow or brown nasal discharge or pain in the face, especially when you bend forward. These may be signs of sinusitis.  You develop a fever, swollen neck glands, pain with swallowing, or white areas in the back of your throat. These may be signs of strep throat. SEEK IMMEDIATE MEDICAL CARE IF:   You have a fever.  You develop severe or persistent headache, ear pain, sinus pain, or chest pain.  You develop wheezing, a prolonged cough, cough up blood, or have a change in your usual mucus (if you have chronic lung disease).  You develop sore muscles or a stiff neck. Document Released: 07/28/2000 Document Revised: 04/26/2011 Document Reviewed: 06/05/2010 [facility] Patient Information 2014 [place], [place].

## 2013-03-05 NOTE — ED Provider Notes (Signed)
CSN: [id number]     Arrival date & time 03/05/13  0802 History   First MD Initiated Contact with Patient 03/05/13 [phone]     Chief Complaint  Patient presents with  . URI   (Consider location/radiation/quality/duration/timing/severity/associated sxs/prior Treatment) HPI Comments: 43 year old male presents with cough, nasal congestion, achiness in the bones, low-grade fever and feeling sick with fatigue and malaise 3 days ago. He was seen in the urgent care nearly 3 weeks ago with similar symptoms that with nausea vomiting and diarrhea. He was treated with Augmentin and after several days he felt a low bit better until 3 days ago when he started these new symptoms.   History reviewed. No pertinent past medical history. Past Surgical History  Procedure Laterality Date  . I&d extremity  04/19/2011    Procedure: IRRIGATION AND DEBRIDEMENT EXTREMITY;  Surgeon: Tami RibasKevin R Kuzma, MD;  Location: [facility];  Service: Orthopedics;  Laterality: Left;  I&D left long finger   Family History  Problem Relation Age of Onset  . Diabetes Mother   . Hypertension Mother   . Brain cancer Sister    History  Substance Use Topics  . Smoking status: Current Every Day Smoker -- 15 years    Types: Cigarettes  . Smokeless tobacco: Not on file  . Alcohol Use: Yes     Comment: weekends    Review of Systems  Constitutional: Positive for fever, activity change and appetite change. Negative for diaphoresis and fatigue.  HENT: Positive for congestion, postnasal drip, rhinorrhea, sinus pressure, sore throat and trouble swallowing. Negative for ear pain and facial swelling.   Eyes: Negative for pain, discharge and redness.  Respiratory: Positive for cough. Negative for chest tightness and shortness of breath.   Cardiovascular: Negative.   Gastrointestinal: Negative.   Genitourinary: Negative.   Musculoskeletal: Negative.  Negative for neck pain and neck stiffness.  Neurological: Negative.      Allergies  Review of patient's allergies indicates no known allergies.  Home Medications   Current Outpatient Rx  Name  Route  Sig  Dispense  Refill  . albuterol (PROVENTIL HFA;VENTOLIN HFA) 108 (90 BASE) MCG/ACT inhaler      2 puffs q 4 h prn cough and wheeze.   1 Inhaler   0   . guaiFENesin-codeine (CHERATUSSIN AC) 100-10 MG/5ML syrup   Oral   Take 5 mLs by mouth every 4 (four) hours as needed for cough or congestion.   120 mL   0    BP 137/88  Pulse 84  Temp(Src) 99.1 F (37.3 C) (Oral)  Resp 16  SpO2 98% Physical Exam  Nursing note and vitals reviewed. Constitutional: He is oriented to person, place, and time. He appears well-developed and well-nourished. No distress.  HENT:  Bilateral TMs are normal Oropharynx with minor erythema and cobblestoning no exudate  Eyes: Conjunctivae and EOM are normal.  Neck: Normal range of motion. Neck supple.  Cardiovascular: Normal rate and regular rhythm.   Pulmonary/Chest: Effort normal and breath sounds normal. No respiratory distress. He has no wheezes.  Coarseness associated with cough. Cough is frequent and dry.  Musculoskeletal: Normal range of motion. He exhibits no edema.  Lymphadenopathy:    He has no cervical adenopathy.  Neurological: He is alert and oriented to person, place, and time.  Skin: Skin is warm and dry. No rash noted.  Psychiatric: He has a normal mood and affect.    ED Course  Procedures (including critical care time) Labs Review Labs Reviewed -  No data to display Imaging Review No results found.    MDM   1. Viral syndrome   2. URI (upper respiratory infection)   3. Cough      Albuterol HFA as dir cheratussin AC Sudafed PE Allegra 180 q d   Hayden Rasmussen, NP 03/05/13 [phone]

## 2013-03-05 NOTE — ED Provider Notes (Signed)
Medical screening examination/treatment/procedure(s) were performed by non-physician practitioner and as supervising physician I was immediately available for consultation/collaboration.  Binyomin Brann, M.D.  Cian Costanzo C Lauramae Kneisley, MD 03/05/13 1446 

## 2013-12-20 ENCOUNTER — Encounter (HOSPITAL_COMMUNITY): Payer: Self-pay | Admitting: Family Medicine

## 2013-12-20 ENCOUNTER — Emergency Department (HOSPITAL_COMMUNITY)
Admission: EM | Admit: 2013-12-20 | Discharge: 2013-12-20 | Disposition: A | Discharge status: Home / Self Care | Payer: Managed Care, Other (non HMO) | Source: Home / Self Care | Attending: Family Medicine | Admitting: Family Medicine

## 2013-12-20 DIAGNOSIS — L739 Follicular disorder, unspecified: Secondary | ICD-10-CM

## 2013-12-20 DIAGNOSIS — L0390 Cellulitis, unspecified: Secondary | ICD-10-CM

## 2013-12-20 DIAGNOSIS — L0291 Cutaneous abscess, unspecified: Secondary | ICD-10-CM

## 2013-12-20 DIAGNOSIS — B353 Tinea pedis: Secondary | ICD-10-CM

## 2013-12-20 MED ORDER — CHLORHEXIDINE GLUCONATE 4 % EX LIQD: Freq: Every day | CUTANEOUS | Status: DC | PRN

## 2013-12-20 MED ORDER — CLINDAMYCIN HCL 300 MG PO CAPS: 300.0000 mg | ORAL_CAPSULE | Freq: Three times a day (TID) | ORAL | Status: DC

## 2013-12-20 MED ORDER — KETOCONAZOLE 2 % EX CREA: 1.0000 "application " | TOPICAL_CREAM | Freq: Every day | CUTANEOUS | Status: DC

## 2013-12-20 NOTE — Discharge Instructions (Signed)
You have a bacterial and fungal infection of the foot. Please take the antibiotics as prescribed for the full 10 days Please use the antifungal cream as prescribed. Please keep your foot open to the air as much as possible Please remember to use the antibacterial wash 1-2 times weekly after the antibiotics Switch to an antibacterial soap Please come back if you do not get better.  Take 600-800mg  of ibuprofen every 6-8 hours

## 2013-12-20 NOTE — ED Provider Notes (Signed)
CSN: [id number]     Arrival date & time 12/20/13  0815 History   First MD Initiated Contact with Patient 12/20/13 [phone]     Chief Complaint  Patient presents with  . Foot Pain   (Consider location/radiation/quality/duration/timing/severity/associated sxs/prior Treatment) HPI  L foot wound: started last week. Boil on top of foot. Popped 2 days ago. Becoming more painful. Continues to drain. Neosporin w/o much benefit. Worse w/ ambulation. Wears boots all day - works [employer]. Minimal purulent discharge. White thick material between toes. Lamisil w/o benefit.   Back rash. Present for months. Small bumps - like zits. Nothing makes it better or worse. Denies fevers. Does not use antibacterial soap  H/o complex finger infection and wound requiring surgical debridement and healing by secondary intention    History reviewed. No pertinent past medical history. Past Surgical History  Procedure Laterality Date  . I&d extremity  04/19/2011    Procedure: IRRIGATION AND DEBRIDEMENT EXTREMITY;  Surgeon: Tami RibasKevin R Kuzma, MD;  [facility];  Service: Orthopedics;  Laterality: Left;  I&D left long finger   Family History  Problem Relation Age of Onset  . Diabetes Mother   . Hypertension Mother   . Brain cancer Sister    History  Substance Use Topics  . Smoking status: Current Every Day Smoker -- 1.00 packs/day for 15 years    Types: Cigarettes  . Smokeless tobacco: Not on file  . Alcohol Use: Yes     Comment: weekends    Review of Systems Per HPI with all other pertinent systems negative.   Allergies  Review of patient's allergies indicates no known allergies.  Home Medications   Prior to Admission medications   Medication Sig Start Date End Date Taking? Authorizing Provider  albuterol (PROVENTIL HFA;VENTOLIN HFA) 108 (90 BASE) MCG/ACT inhaler 2 puffs q 4 h prn cough and wheeze. 03/05/13   Hayden Rasmussenavid Mabe, NP  chlorhexidine (HIBICLENS) 4 % external liquid Apply  topically daily as needed. 12/20/13   Ozella Rocksavid J Merrell, MD  clindamycin (CLEOCIN) 300 MG capsule Take 1 capsule (300 mg total) by mouth 3 (three) times daily. 12/20/13   Ozella Rocksavid J Merrell, MD  guaiFENesin-codeine [facility](CHERATUSSIN AC) 100-10 MG/5ML syrup Take 5 mLs by mouth every 4 (four) hours as needed for cough or congestion. 03/05/13   Hayden Rasmussenavid Mabe, NP  ketoconazole (NIZORAL) 2 % cream Apply 1 application topically daily. 12/20/13   Ozella Rocksavid J Merrell, MD   BP 130/87 mmHg  Pulse 61  Temp(Src) 97.7 F (36.5 C) (Oral)  Resp 18  SpO2 97% Physical Exam  Constitutional: He is oriented to person, place, and time. He appears well-developed and well-nourished.  HENT:  Head: Normocephalic and atraumatic.  Eyes: EOM are normal. Pupils are equal, round, and reactive to light.  Neck: Normal range of motion.  Pulmonary/Chest: Effort normal. No respiratory distress.  Musculoskeletal: Normal range of motion. He exhibits no edema or tenderness.  Neurological: He is oriented to person, place, and time.  Skin:  Thick white material w/ surounding skin maceration between the 4-5th toes.  Dorsum of the foot w/ 1cm wound w/ central purulence and minimal surounding erythema of the 4-5 toe bases.  Back w/ dozens of open and closed comodomal lesions, some w/ 0.5-1cm surounding induration.    Psychiatric: He has a normal mood and affect. His behavior is normal. Judgment and thought content normal.    ED Course  Procedures (including critical care time) Labs Review Labs Reviewed  WOUND CULTURE  Imaging Review No results found.   MDM   1. Cellulitis and abscess   2. Tinea pedis of left foot   3. Folliculitis    CLinda 300 TID x 10 days ketoxconazole cream Start using antibacterial soap Hibiclens Wound Cx sent  Precautions given and all questions answered  Shelly Flattenavid Merrell, MD Family Medicine 12/20/2013, 9:04 AM       Ozella Rocksavid J Merrell, MD 12/20/13 [phone]

## 2013-12-20 NOTE — ED Notes (Signed)
Reports having a blister between the 4th and 5th toe.  States that blister popped but area is not healing well.  States feet stay wet constantly from working in [employer].  Pt has used neosporin and athletes foot cream with no relief.    Having mild swelling and pain with walking.

## 2013-12-24 LAB — WOUND CULTURE: Gram Stain: NONE SEEN

## 2013-12-25 ENCOUNTER — Telehealth (HOSPITAL_COMMUNITY): Payer: Self-pay | Admitting: *Deleted

## 2013-12-25 ENCOUNTER — Encounter (HOSPITAL_COMMUNITY): Payer: Self-pay | Admitting: Emergency Medicine

## 2013-12-25 ENCOUNTER — Emergency Department (INDEPENDENT_AMBULATORY_CARE_PROVIDER_SITE_OTHER)
Admission: EM | Admit: 2013-12-25 | Discharge: 2013-12-25 | Disposition: A | Discharge status: Home / Self Care | Payer: Managed Care, Other (non HMO) | Source: Home / Self Care | Attending: Family Medicine | Admitting: Family Medicine

## 2013-12-25 DIAGNOSIS — K529 Noninfective gastroenteritis and colitis, unspecified: Secondary | ICD-10-CM

## 2013-12-25 MED ORDER — ALIGN 4 MG PO CAPS: 1.0000 | ORAL_CAPSULE | Freq: Every day | ORAL | Status: DC

## 2013-12-25 MED ORDER — HYOSCYAMINE SULFATE 0.125 MG SL SUBL: 0.1250 mg | SUBLINGUAL_TABLET | SUBLINGUAL | Status: DC | PRN

## 2013-12-25 MED ORDER — SULFAMETHOXAZOLE-TRIMETHOPRIM 800-160 MG PO TABS: 1.0000 | ORAL_TABLET | Freq: Two times a day (BID) | ORAL | Status: AC

## 2013-12-25 NOTE — Discharge Instructions (Signed)
Please stop taking clindamycin and begin taking medications prescribed today. Return with stool sample when available. If symptoms become suddenly worse, severe, your develop fever or blood in your stool, please seek re-evaluation at your nearest ER.   Colitis Colitis is inflammation of the colon. Colitis can be a short-term or long-standing (chronic) illness. Crohn's disease and ulcerative colitis are 2 types of colitis which are chronic. They usually require lifelong treatment. CAUSES  There are many different causes of colitis, including:  Viruses.  Germs (bacteria).  Medicine reactions. SYMPTOMS   Diarrhea.  Intestinal bleeding.  Pain.  Fever.  Throwing up (vomiting).  Tiredness (fatigue).  Weight loss.  Bowel blockage. DIAGNOSIS  The diagnosis of colitis is based on examination and stool or blood tests. X-rays, CT scan, and colonoscopy may also be needed. TREATMENT  Treatment may include:  Fluids given through the vein (intravenously).  Bowel rest (nothing to eat or drink for a period of time).  Medicine for pain and diarrhea.  Medicines (antibiotics) that kill germs.  Cortisone medicines.  Surgery. HOME CARE INSTRUCTIONS   Get plenty of rest.  Drink enough water and fluids to keep your urine clear or pale yellow.  Eat a well-balanced diet.  Call your caregiver for follow-up as recommended. SEEK IMMEDIATE MEDICAL CARE IF:   You develop chills.  You have an oral temperature above 102 F (38.9 C), not controlled by medicine.  You have extreme weakness, fainting, or dehydration.  You have repeated vomiting.  You develop severe belly (abdominal) pain or are passing bloody or tarry stools. MAKE SURE YOU:   Understand these instructions.  Will watch your condition.  Will get help right away if you are not doing well or get worse. Document Released: 03/11/2004 Document Revised: 04/26/2011 Document Reviewed: 06/06/2009 [facility] Patient  Information 2015 [place], [place]. This information is not intended to replace advice given to you by your health care provider. Make sure you discuss any questions you have with your health care provider.  Food Choices to Help Relieve Diarrhea When you have diarrhea, the foods you eat and your eating habits are very important. Choosing the right foods and drinks can help relieve diarrhea. Also, because diarrhea can last up to 7 days, you need to replace lost fluids and electrolytes (such as sodium, potassium, and chloride) in order to help prevent dehydration.  WHAT GENERAL GUIDELINES DO I NEED TO FOLLOW?  Slowly drink 1 cup (8 oz) of fluid for each episode of diarrhea. If you are getting enough fluid, your urine will be clear or pale yellow.  Eat starchy foods. Some good choices include white rice, white toast, pasta, low-fiber cereal, baked potatoes (without the skin), saltine crackers, and bagels.  Avoid large servings of any cooked vegetables.  Limit fruit to two servings per day. A serving is  cup or 1 small piece.  Choose foods with less than 2 g of fiber per serving.  Limit fats to less than 8 tsp (38 g) per day.  Avoid fried foods.  Eat foods that have probiotics in them. Probiotics can be found in certain dairy products.  Avoid foods and beverages that may increase the speed at which food moves through the stomach and intestines (gastrointestinal tract). Things to avoid include:  High-fiber foods, such as dried fruit, raw fruits and vegetables, nuts, seeds, and whole grain foods.  Spicy foods and high-fat foods.  Foods and beverages sweetened with high-fructose corn syrup, honey, or sugar alcohols such as xylitol, sorbitol, and mannitol.  WHAT FOODS ARE RECOMMENDED? Grains White rice. White, [place]French, or pita breads (fresh or toasted), including plain rolls, buns, or bagels. White pasta. Saltine, soda, or graham crackers. Pretzels. Low-fiber cereal. Cooked cereals made with water  (such as cornmeal, farina, or cream cereals). Plain muffins. Matzo. Melba toast. Zwieback.  Vegetables Potatoes (without the skin). Strained tomato and vegetable juices. Most well-cooked and canned vegetables without seeds. Tender lettuce. Fruits Cooked or canned applesauce, apricots, cherries, fruit cocktail, grapefruit, peaches, pears, or plums. Fresh bananas, apples without skin, cherries, grapes, cantaloupe, grapefruit, peaches, oranges, or plums.  Meat and Other Protein Products Baked or boiled chicken. Eggs. Tofu. Fish. Seafood. Smooth peanut butter. Ground or well-cooked tender beef, ham, veal, lamb, pork, or poultry.  Dairy Plain yogurt, kefir, and unsweetened liquid yogurt. Lactose-free milk, buttermilk, or soy milk. Plain hard cheese. Beverages Sport drinks. Clear broths. Diluted fruit juices (except prune). Regular, caffeine-free sodas such as ginger ale. Water. Decaffeinated teas. Oral rehydration solutions. Sugar-free beverages not sweetened with sugar alcohols. Other Bouillon, broth, or soups made from recommended foods.  The items listed above may not be a complete list of recommended foods or beverages. Contact your dietitian for more options. WHAT FOODS ARE NOT RECOMMENDED? Grains Whole grain, whole wheat, bran, or rye breads, rolls, pastas, crackers, and cereals. Wild or brown rice. Cereals that contain more than 2 g of fiber per serving. Corn tortillas or taco shells. Cooked or dry oatmeal. Granola. Popcorn. Vegetables Raw vegetables. Cabbage, broccoli, Brussels sprouts, artichokes, baked beans, beet greens, corn, kale, legumes, peas, sweet potatoes, and yams. Potato skins. Cooked spinach and cabbage. Fruits Dried fruit, including raisins and dates. Raw fruits. Stewed or dried prunes. Fresh apples with skin, apricots, mangoes, pears, raspberries, and strawberries.  Meat and Other Protein Products Chunky peanut butter. Nuts and seeds. Beans and lentils. Tomasa BlaseBacon.   Dairy High-fat cheeses. Milk, chocolate milk, and beverages made with milk, such as milk shakes. Cream. Ice cream. Sweets and Desserts Sweet rolls, doughnuts, and sweet breads. Pancakes and waffles. Fats and Oils Butter. Cream sauces. Margarine. Salad oils. Plain salad dressings. Olives. Avocados.  Beverages Caffeinated beverages (such as coffee, tea, soda, or energy drinks). Alcoholic beverages. Fruit juices with pulp. Prune juice. Soft drinks sweetened with high-fructose corn syrup or sugar alcohols. Other Coconut. Hot sauce. Chili powder. Mayonnaise. Gravy. Cream-based or milk-based soups.  The items listed above may not be a complete list of foods and beverages to avoid. Contact your dietitian for more information. WHAT SHOULD I DO IF I BECOME DEHYDRATED? Diarrhea can sometimes lead to dehydration. Signs of dehydration include dark urine and dry mouth and skin. If you think you are dehydrated, you should rehydrate with an oral rehydration solution. These solutions can be purchased at pharmacies, retail stores, or online.  Drink -1 cup (120-240 mL) of oral rehydration solution each time you have an episode of diarrhea. If drinking this amount makes your diarrhea worse, try drinking smaller amounts more often. For example, drink 1-3 tsp (5-15 mL) every 5-10 minutes.  A general rule for staying hydrated is to drink 1-2 L of fluid per day. Talk to your health care provider about the specific amount you should be drinking each day. Drink enough fluids to keep your urine clear or pale yellow. Document Released: 04/24/2003 Document Revised: 02/06/2013 Document Reviewed: 12/25/2012 [facility] Patient Information 2015 [place], [place]. This information is not intended to replace advice given to you by your health care provider. Make sure you discuss any questions you have with  your health care provider. ° °

## 2013-12-25 NOTE — ED Notes (Addendum)
Wound culture L foot: few MRSA.  Pt. adequately treated with Cleocin. I called pt. and left a message to call.  Call 1. Vassie MoselleYork, Nyjae Hodge M 12/25/2013 Pt. called back and a  message was taken. I called pt. back. Pt. verified x 2 and given result. Pt. told he was adequately treated with Cleocin.  He said he came back today and told the doctor the Cleocin was giving him cramps. He said they changed him to Septra DS.  [facility] the [facility] with him.  Pt. voiced understanding. Vassie MoselleYork, Marjan Rosman M 12/25/2013

## 2013-12-25 NOTE — ED Notes (Signed)
Pt is here today for abdominal pain and diarrhea, pt said that the diarrhea and abdominal pain started yesterday, abdominal pain is on the left lower side, pt is sitting upright and in no apparent distress

## 2013-12-25 NOTE — ED Provider Notes (Signed)
CSN: [id number]     Arrival date & time 12/25/13  7616 History   First MD Initiated Contact with Patient 12/25/13 [phone]     Chief Complaint  Patient presents with  . Abdominal Pain   (Consider location/radiation/quality/duration/timing/severity/associated sxs/prior Treatment) HPI Comments: Patient reports he began taking oral clindamycin for left foot abscess four days ago. Reports that over the past 24 hours he has developed LUQ and LLQ abdominal pain with associated anorexia and diarrhea. Denies fever, blood in stool, nausea or vomiting. Reports himself to be otherwise healthy. Denies GU issues.  Smoker Works in [employer]   Patient is a 43 y.o. male presenting with abdominal pain. The history is provided by the patient.  Abdominal Pain   History reviewed. No pertinent past medical history. Past Surgical History  Procedure Laterality Date  . I&d extremity  04/19/2011    Procedure: IRRIGATION AND DEBRIDEMENT EXTREMITY;  Surgeon: Tennis Must, MD;  Location: [place];  Service: Orthopedics;  Laterality: Left;  I&D left long finger   Family History  Problem Relation Age of Onset  . Diabetes Mother   . Hypertension Mother   . Brain cancer Sister    History  Substance Use Topics  . Smoking status: Current Every Day Smoker -- 1.00 packs/day for 15 years    Types: Cigarettes  . Smokeless tobacco: Not on file  . Alcohol Use: Yes     Comment: weekends    Review of Systems  Gastrointestinal: Positive for abdominal pain.  All other systems reviewed and are negative.   Allergies  Review of patient's allergies indicates no known allergies.  Home Medications   Prior to Admission medications   Medication Sig Start Date End Date Taking? Authorizing Provider  albuterol (PROVENTIL HFA;VENTOLIN HFA) 108 (90 BASE) MCG/ACT inhaler 2 puffs q 4 h prn cough and wheeze. 03/05/13   Janne Napoleon, NP  chlorhexidine (HIBICLENS) 4 % external liquid Apply  topically daily as needed. 12/20/13   Waldemar Dickens, MD  guaiFENesin-codeine [facility]) 100-10 MG/5ML syrup Take 5 mLs by mouth every 4 (four) hours as needed for cough or congestion. 03/05/13   Janne Napoleon, NP  hyoscyamine (LEVSIN/SL) 0.125 MG SL tablet Place 1 tablet (0.125 mg total) under the tongue every 4 (four) hours as needed. For abdominal cramping 12/25/13   Audelia Hives Corneshia Hines, PA  ketoconazole (NIZORAL) 2 % cream Apply 1 application topically daily. 12/20/13   Waldemar Dickens, MD  Probiotic Product (ALIGN) 4 MG CAPS Take 1 capsule (4 mg total) by mouth daily. X 10 days 12/25/13   Audelia Hives Liticia Gasior, PA  sulfamethoxazole-trimethoprim (BACTRIM DS,SEPTRA DS) 800-160 MG per tablet Take 1 tablet by mouth 2 (two) times daily. X 5 days 12/25/13 01/01/14  Annett Gula H Itzy Adler, PA   BP 140/90 mmHg  Pulse 86  Temp(Src) 98.7 F (37.1 C) (Oral)  Resp 12  SpO2 100% Physical Exam  Constitutional: He is oriented to person, place, and time. He appears well-developed and well-nourished. No distress.  HENT:  Head: Normocephalic and atraumatic.  Mouth/Throat: Oropharynx is clear and moist.  Eyes: Conjunctivae are normal. No scleral icterus.  Cardiovascular: Normal rate, regular rhythm and normal heart sounds.   Pulmonary/Chest: Effort normal and breath sounds normal.  Abdominal: Normal appearance. He exhibits no mass. Bowel sounds are decreased. There is tenderness in the left lower quadrant. There is no rigidity, no rebound and no guarding. No hernia.  Musculoskeletal: Normal range of motion.  Neurological: He is alert and oriented to person, place, and time.  Skin: Skin is warm and dry. No rash noted. No erythema.  Psychiatric: He has a normal mood and affect. His behavior is normal.  Nursing note and vitals reviewed.   ED Course  Procedures (including critical care time) Labs Review Labs Reviewed  CLOSTRIDIUM DIFFICILE BY PCR    Imaging Review No results found.   MDM    1. Colitis   My concern is that patient has developed antibiotic associated diarrhea. Vital signs are normal and do not suggest sepsis. Exam only with mild tenderness and without suggestion of acute process requiring surgical intervention. Unable to produce stool specimen while at [facility] for C. diff testing, but will take kit home and return with specimen when available. Wound C&S reviewed from previous visit and results consistent with MRSA that is also sensitive to TMP/SMX. Will discontinue clindamycin and transition to 5 day course of TMP/SMX. I suggested to patient that he may wish to discontinue abx all together, however, he is concerned about stopping therapy all together Will advise plenty of fluids, Levsin as needed for cramping and daily probiotic capsule until diarrhea resolved. He voices understanding that is symptoms become worse despite these modifications in his therapy, he is to report to his nearest ER for re-evaluation.    Lutricia Feil, [place] 12/25/13 1125

## 2013-12-26 LAB — CLOSTRIDIUM DIFFICILE BY PCR: CDIFFPCR: NEGATIVE

## 2014-01-04 ENCOUNTER — Emergency Department (INDEPENDENT_AMBULATORY_CARE_PROVIDER_SITE_OTHER)
Admission: EM | Admit: 2014-01-04 | Discharge: 2014-01-04 | Disposition: A | Discharge status: Home / Self Care | Payer: Managed Care, Other (non HMO) | Source: Home / Self Care | Attending: Family Medicine | Admitting: Family Medicine

## 2014-01-04 ENCOUNTER — Encounter (HOSPITAL_COMMUNITY): Payer: Self-pay | Admitting: Emergency Medicine

## 2014-01-04 DIAGNOSIS — B353 Tinea pedis: Secondary | ICD-10-CM

## 2014-01-04 DIAGNOSIS — L739 Follicular disorder, unspecified: Secondary | ICD-10-CM

## 2014-01-04 DIAGNOSIS — L03119 Cellulitis of unspecified part of limb: Secondary | ICD-10-CM

## 2014-01-04 MED ORDER — TERBINAFINE HCL 250 MG PO TABS: 250.0000 mg | ORAL_TABLET | Freq: Every day | ORAL | Status: DC

## 2014-01-04 MED ORDER — SULFAMETHOXAZOLE-TRIMETHOPRIM 800-160 MG PO TABS: 2.0000 | ORAL_TABLET | Freq: Two times a day (BID) | ORAL | Status: DC

## 2014-01-04 NOTE — ED Notes (Signed)
Pt states that he is here for a follow up for infection in left foot.

## 2014-01-04 NOTE — Discharge Instructions (Signed)
Thank you for coming in today. Take antibiotics and antifungal medicine twice daily Make sure to clean your feet well Come back as needed   Athlete's Foot Athlete's foot (tinea pedis) is a fungal infection of the skin on the feet. It often occurs on the skin between the toes or underneath the toes. It can also occur on the soles of the feet. Athlete's foot is more likely to occur in hot, humid weather. Not washing your feet or changing your socks often enough can contribute to athlete's foot. The infection can spread from person to person (contagious). CAUSES Athlete's foot is caused by a fungus. This fungus thrives in warm, moist places. Most people get athlete's foot by sharing shower stalls, towels, and wet floors with an infected person. People with weakened immune systems, including those with diabetes, may be more likely to get athlete's foot. SYMPTOMS   Itchy areas between the toes or on the soles of the feet.  White, flaky, or scaly areas between the toes or on the soles of the feet.  Tiny, intensely itchy blisters between the toes or on the soles of the feet.  Tiny cuts on the skin. These cuts can develop a bacterial infection.  Thick or discolored toenails. DIAGNOSIS  Your caregiver can usually tell what the problem is by doing a physical exam. Your caregiver may also take a skin sample from the rash area. The skin sample may be examined under a microscope, or it may be tested to see if fungus will grow in the sample. A sample may also be taken from your toenail for testing. TREATMENT  Over-the-counter and prescription medicines can be used to kill the fungus. These medicines are available as powders or creams. Your caregiver can suggest medicines for you. Fungal infections respond slowly to treatment. You may need to continue using your medicine for several weeks. PREVENTION   Do not share towels.  Wear sandals in wet areas, such as shared locker rooms and shared  showers.  Keep your feet dry. Wear shoes that allow air to circulate. Wear cotton or wool socks. HOME CARE INSTRUCTIONS   Take medicines as directed by your caregiver. Do not use steroid creams on athlete's foot.  Keep your feet clean and cool. Wash your feet daily and dry them thoroughly, especially between your toes.  Change your socks every day. Wear cotton or wool socks. In hot climates, you may need to change your socks 2 to 3 times per day.  Wear sandals or canvas tennis shoes with good air circulation.  If you have blisters, soak your feet in Burow's solution or Epsom salts for 20 to 30 minutes, 2 times a day to dry out the blisters. Make sure you dry your feet thoroughly afterward. SEEK MEDICAL CARE IF:   You have a fever.  You have swelling, soreness, warmth, or redness in your foot.  You are not getting better after 7 days of treatment.  You are not completely cured after 30 days.  You have any problems caused by your medicines. MAKE SURE YOU:   Understand these instructions.  Will watch your condition.  Will get help right away if you are not doing well or get worse. Document Released: 01/30/2000 Document Revised: 04/26/2011 Document Reviewed: 11/20/2010 [facility] Patient Information 2015 [place], [place]. This information is not intended to replace advice given to you by your health care provider. Make sure you discuss any questions you have with your health care provider.   Cellulitis Cellulitis is an  infection of the skin and the tissue beneath it. The infected area is usually red and tender. Cellulitis occurs most often in the arms and lower legs.  CAUSES  Cellulitis is caused by bacteria that enter the skin through cracks or cuts in the skin. The most common types of bacteria that cause cellulitis are staphylococci and streptococci. SIGNS AND SYMPTOMS   Redness and warmth.  Swelling.  Tenderness or pain.  Fever. DIAGNOSIS  Your health care provider can  usually determine what is wrong based on a physical exam. Blood tests may also be done. TREATMENT  Treatment usually involves taking an antibiotic medicine. HOME CARE INSTRUCTIONS   Take your antibiotic medicine as directed by your health care provider. Finish the antibiotic even if you start to feel better.  Keep the infected arm or leg elevated to reduce swelling.  Apply a warm cloth to the affected area up to 4 times per day to relieve pain.  Take medicines only as directed by your health care provider.  Keep all follow-up visits as directed by your health care provider. SEEK MEDICAL CARE IF:   You notice red streaks coming from the infected area.  Your red area gets larger or turns dark in color.  Your bone or joint underneath the infected area becomes painful after the skin has healed.  Your infection returns in the same area or another area.  You notice a swollen bump in the infected area.  You develop new symptoms.  You have a fever. SEEK IMMEDIATE MEDICAL CARE IF:   You feel very sleepy.  You develop vomiting or diarrhea.  You have a general ill feeling (malaise) with muscle aches and pains. MAKE SURE YOU:   Understand these instructions.  Will watch your condition.  Will get help right away if you are not doing well or get worse. Document Released: 11/11/2004 Document Revised: 06/18/2013 Document Reviewed: 04/19/2011 [facility] Patient Information 2015 [place], [place]. This information is not intended to replace advice given to you by your health care provider. Make sure you discuss any questions you have with your health care provider.

## 2014-01-04 NOTE — ED Provider Notes (Signed)
Roy Lowe is a 43 y.o. male who presents to Urgent Care today for left toe infection. Patient has had a intermittent/persistent infection at the interdigital webspace between the fourth and fifth toes of the left foot. He was initially given clindamycin but was unable to tolerate it due to diarrhea. He was given a 5 day course of Bactrim which helped. However his symptoms returned after he completed the antibiotics. He denies any fevers or chills vomiting or diarrhea. He notes mild pain.   History reviewed. No pertinent past medical history. Past Surgical History  Procedure Laterality Date  . I&d extremity  04/19/2011    Procedure: IRRIGATION AND DEBRIDEMENT EXTREMITY;  Surgeon: Tami RibasKevin R Kuzma, MD;  Location: [facility];  Service: Orthopedics;  Laterality: Left;  I&D left long finger   History  Substance Use Topics  . Smoking status: Current Every Day Smoker -- 1.00 packs/day for 15 years    Types: Cigarettes  . Smokeless tobacco: Not on file  . Alcohol Use: Yes     Comment: weekends   ROS as above Medications: No current facility-administered medications for this encounter.   Current Outpatient Prescriptions  Medication Sig Dispense Refill  . albuterol (PROVENTIL HFA;VENTOLIN HFA) 108 (90 BASE) MCG/ACT inhaler 2 puffs q 4 h prn cough and wheeze. 1 Inhaler 0  . chlorhexidine (HIBICLENS) 4 % external liquid Apply topically daily as needed. 120 mL 0  . hyoscyamine (LEVSIN/SL) 0.125 MG SL tablet Place 1 tablet (0.125 mg total) under the tongue every 4 (four) hours as needed. For abdominal cramping 30 tablet 0  . ketoconazole (NIZORAL) 2 % cream Apply 1 application topically daily. 15 g 0  . Probiotic Product (ALIGN) 4 MG CAPS Take 1 capsule (4 mg total) by mouth daily. X 10 days 10 capsule 0  . sulfamethoxazole-trimethoprim (SEPTRA DS) 800-160 MG per tablet Take 2 tablets by mouth 2 (two) times daily. 40 tablet 0  . terbinafine (LAMISIL) 250 MG tablet Take 1 tablet (250 mg  total) by mouth daily. 28 tablet 0   No Known Allergies   Exam:  BP 125/78 mmHg  Pulse 69  Temp(Src) 97.6 F (36.4 C) (Oral)  Resp 20  SpO2 95% Gen: Well NAD Left foot: Erythema at the interdigital webspace history in the fourth and fifth toes with some bleeding. No fluctuance or pus expressed. Tender to touch. The interdigital webspace between the other toes of the left feet have whitish material and are mildly tender.  No results found for this or any previous visit (from the past 24 hour(s)). No results found.  Assessment and Plan: 43 y.o. male with tinea pedis with cellulitis. Treatment with oral terbinafine and Bactrim. 10 days of Bactrim 2 weeks of terbinafine. Discussed foot hygiene. Follow-up as needed. Work note provided.  Discussed warning signs or symptoms. Please see discharge instructions. Patient expresses understanding.     Rodolph BongEvan S Corey, MD 01/04/14 [phone]

## 2014-03-12 ENCOUNTER — Encounter (HOSPITAL_COMMUNITY): Payer: Self-pay | Admitting: Emergency Medicine

## 2014-03-12 ENCOUNTER — Emergency Department (HOSPITAL_COMMUNITY)
Admission: EM | Admit: 2014-03-12 | Discharge: 2014-03-12 | Disposition: A | Discharge status: Home / Self Care | Payer: Managed Care, Other (non HMO) | Source: Home / Self Care | Attending: Family Medicine | Admitting: Family Medicine

## 2014-03-12 DIAGNOSIS — L739 Follicular disorder, unspecified: Secondary | ICD-10-CM

## 2014-03-12 HISTORY — DX: Methicillin resistant Staphylococcus aureus infection, unspecified site: A49.02

## 2014-03-12 LAB — MRSA PCR SCREENING: MRSA by PCR: NEGATIVE

## 2014-03-12 MED ORDER — KETOROLAC TROMETHAMINE 30 MG/ML IJ SOLN
30.0000 mg | Freq: Once | INTRAMUSCULAR | Status: AC
  Administered 2014-03-12: 30 mg via INTRAMUSCULAR

## 2014-03-12 MED ORDER — CHLORHEXIDINE GLUCONATE 4 % EX LIQD
Freq: Every day | CUTANEOUS | Status: AC | PRN
Start: 2014-03-12 — End: ?

## 2014-03-12 MED ORDER — MUPIROCIN 2 % EX OINT: 1.0000 "application " | TOPICAL_OINTMENT | Freq: Two times a day (BID) | CUTANEOUS | Status: DC

## 2014-03-12 MED ORDER — KETOROLAC TROMETHAMINE 30 MG/ML IJ SOLN
INTRAMUSCULAR | Status: AC
  Filled 2014-03-12: qty 1

## 2014-03-12 MED ORDER — SULFAMETHOXAZOLE-TRIMETHOPRIM 800-160 MG PO TABS: 1.0000 | ORAL_TABLET | Freq: Two times a day (BID) | ORAL | Status: DC

## 2014-03-12 NOTE — ED Provider Notes (Signed)
CSN: [id number]     Arrival date & time 03/12/14  1052 History   First MD Initiated Contact with Patient 03/12/14 1109     Chief Complaint  Patient presents with  . Rectal Pain   (Consider location/radiation/quality/duration/timing/severity/associated sxs/prior Treatment) HPI  Skin bump: located at top of gluteal cleft. Worse w/ pressure. Ongoing tfor 2 days. Getting worse. deneis discharge. Denies fevers, general malaise, CP, palpitations. Changing to a red color. Has not tried anything to make it better. H/o MRSA infections. Ran out of Hibiclens several weeks ago  Past Medical History  Diagnosis Date  . MRSA infection    Past Surgical History  Procedure Laterality Date  . I&d extremity  04/19/2011    Procedure: IRRIGATION AND DEBRIDEMENT EXTREMITY;  Surgeon: Tami Ribas, MD;  Location: [facility];  Service: Orthopedics;  Laterality: Left;  I&D left long finger   Family History  Problem Relation Age of Onset  . Diabetes Mother   . Hypertension Mother   . Brain cancer Sister    History  Substance Use Topics  . Smoking status: Current Every Day Smoker -- 1.00 packs/day for 15 years    Types: Cigarettes  . Smokeless tobacco: Not on file  . Alcohol Use: Yes     Comment: weekends    Review of Systems Per HPI with all other pertinent systems negative.   Allergies  Review of patient's allergies indicates no known allergies.  Home Medications   Prior to Admission medications   Medication Sig Start Date End Date Taking? Authorizing Provider  albuterol (PROVENTIL HFA;VENTOLIN HFA) 108 (90 BASE) MCG/ACT inhaler 2 puffs q 4 h prn cough and wheeze. 03/05/13   Hayden Rasmussen, NP  chlorhexidine (HIBICLENS) 4 % external liquid Apply topically daily as needed. 03/12/14   Ozella Rocks, MD  hyoscyamine (LEVSIN/SL) 0.125 MG SL tablet Place 1 tablet (0.125 mg total) under the tongue every 4 (four) hours as needed. For abdominal cramping 12/25/13   Mathis Fare Presson, PA   ketoconazole (NIZORAL) 2 % cream Apply 1 application topically daily. 12/20/13   Ozella Rocks, MD  Probiotic Product (ALIGN) 4 MG CAPS Take 1 capsule (4 mg total) by mouth daily. X 10 days 12/25/13   Mathis Fare Presson, PA  sulfamethoxazole-trimethoprim (BACTRIM DS,SEPTRA DS) 800-160 MG per tablet Take 1 tablet by mouth 2 (two) times daily. 03/12/14   Ozella Rocks, MD  terbinafine (LAMISIL) 250 MG tablet Take 1 tablet (250 mg total) by mouth daily. 01/04/14   Rodolph Bong, MD   BP 127/89 mmHg  Pulse 89  Temp(Src) 97.8 F (36.6 C) (Oral)  Resp 16  SpO2 96% Physical Exam  Constitutional: He is oriented to person, place, and time. He appears well-developed and well-nourished.  HENT:  Head: Normocephalic and atraumatic.  Eyes: Pupils are equal, round, and reactive to light.  Neck: Normal range of motion.  Cardiovascular: Normal rate.   Pulmonary/Chest: Effort normal and breath sounds normal.  Abdominal: Soft. Bowel sounds are normal.  Musculoskeletal: Normal range of motion.  Neurological: He is alert and oriented to person, place, and time.  Skin:  Gluteal region w/ Numerous follicles w/ central purulence but no actual abscess. No areas of induration or fluctuance. Minimal ttp.   Psychiatric: He has a normal mood and affect. His behavior is normal. Judgment and thought content normal.    ED Course  Procedures (including critical care time) Labs Review Labs Reviewed - No data to display  Imaging Review No results found.   MDM   1. 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

## 2014-03-12 NOTE — Discharge Instructions (Signed)
Youi have another folliculitis infection This should clear up with antibiotics Please use the body cleanser daily after stopping the antibiotics Please follow up with your new doctor There is no evidence of an abscess. Please use the nasal medicine twice daily for the next 3-5 days then from time to time as symptoms return

## 2014-03-12 NOTE — ED Notes (Signed)
Patient reports pain in buttocks, top of buttocks, noticed 1/24.  History of mrsa

## 2014-10-13 ENCOUNTER — Emergency Department (HOSPITAL_COMMUNITY): Payer: Managed Care, Other (non HMO)

## 2014-10-13 ENCOUNTER — Encounter (HOSPITAL_COMMUNITY): Payer: Self-pay | Admitting: Emergency Medicine

## 2014-10-13 ENCOUNTER — Emergency Department (HOSPITAL_COMMUNITY)
Admission: EM | Admit: 2014-10-13 | Discharge: 2014-10-13 | Disposition: A | Discharge status: Home / Self Care | Payer: Managed Care, Other (non HMO) | Attending: Emergency Medicine | Admitting: Emergency Medicine

## 2014-10-13 DIAGNOSIS — Z8614 Personal history of Methicillin resistant Staphylococcus aureus infection: Secondary | ICD-10-CM | POA: Insufficient documentation

## 2014-10-13 DIAGNOSIS — R109 Unspecified abdominal pain: Secondary | ICD-10-CM | POA: Insufficient documentation

## 2014-10-13 DIAGNOSIS — L0231 Cutaneous abscess of buttock: Secondary | ICD-10-CM

## 2014-10-13 DIAGNOSIS — M545 Low back pain: Secondary | ICD-10-CM | POA: Diagnosis not present

## 2014-10-13 DIAGNOSIS — R10A1 Flank pain, right side: Secondary | ICD-10-CM

## 2014-10-13 DIAGNOSIS — Z720 Tobacco use: Secondary | ICD-10-CM | POA: Insufficient documentation

## 2014-10-13 DIAGNOSIS — Z79899 Other long term (current) drug therapy: Secondary | ICD-10-CM | POA: Diagnosis not present

## 2014-10-13 DIAGNOSIS — M549 Dorsalgia, unspecified: Secondary | ICD-10-CM

## 2014-10-13 LAB — URINALYSIS, ROUTINE W REFLEX MICROSCOPIC
BILIRUBIN URINE: NEGATIVE
GLUCOSE, UA: NEGATIVE mg/dL
Ketones, ur: NEGATIVE mg/dL
LEUKOCYTES UA: NEGATIVE
NITRITE: NEGATIVE
PH: 5.5 (ref 5.0–8.0)
Protein, ur: NEGATIVE mg/dL
SPECIFIC GRAVITY, URINE: 1.023 (ref 1.005–1.030)
Urobilinogen, UA: 1 mg/dL (ref 0.0–1.0)

## 2014-10-13 LAB — URINE MICROSCOPIC-ADD ON

## 2014-10-13 MED ORDER — SULFAMETHOXAZOLE-TRIMETHOPRIM 800-160 MG PO TABS: 1.0000 | ORAL_TABLET | Freq: Two times a day (BID) | ORAL | Status: AC

## 2014-10-13 MED ORDER — DIAZEPAM 2 MG PO TABS: 5.0000 mg | ORAL_TABLET | Freq: Three times a day (TID) | ORAL | Status: AC

## 2014-10-13 MED ORDER — HYDROCODONE-ACETAMINOPHEN 5-325 MG PO TABS: 1.0000 | ORAL_TABLET | Freq: Four times a day (QID) | ORAL | Status: AC | PRN

## 2014-10-13 MED ORDER — HYDROCODONE-ACETAMINOPHEN 5-325 MG PO TABS
1.0000 | ORAL_TABLET | Freq: Once | ORAL | Status: AC
  Administered 2014-10-13: 1 via ORAL
  Filled 2014-10-13: qty 1

## 2014-10-13 MED ORDER — SULFAMETHOXAZOLE-TRIMETHOPRIM 400-80 MG PO TABS
1.0000 | ORAL_TABLET | Freq: Two times a day (BID) | ORAL | Status: DC
  Administered 2014-10-13: 1 via ORAL
  Filled 2014-10-13 (×2): qty 1

## 2014-10-13 MED ORDER — KETOROLAC TROMETHAMINE 30 MG/ML IJ SOLN
30.0000 mg | Freq: Once | INTRAMUSCULAR | Status: AC
  Administered 2014-10-13: 30 mg via INTRAMUSCULAR
  Filled 2014-10-13: qty 1

## 2014-10-13 NOTE — ED Notes (Signed)
Pt given urinal to provide urine sample

## 2014-10-13 NOTE — ED Provider Notes (Signed)
CSN: [id number]     Arrival date & time 10/13/14  1022 History   First MD Initiated Contact with Patient 10/13/14 1025     Chief Complaint  Patient presents with  . Back Pain     (Consider location/radiation/quality/duration/timing/severity/associated sxs/prior Treatment) HPI Patient presents with concern of back pain. Pain began about 5 days ago, soon after patient awoke. Since that time pain is been focally in the right lower back, with occasional radiation of the right posterior leg. No new dysuria, hematuria. There was transient scrotal fullness, but no persistent scrotal symptoms, and no penis or scrotum pain discomfort currently. No ongoing fever, chills per No relief with OTC medication. Patient recalls lifting heavy objects at work the day prior to the onset of symptoms, as well as episodes of back pain in the past. Patient also has concern for firm area palpable in the left supra gluteal cleft. No loss of sensation or strength distally, no falling, no asymmetric weakness, no incontinence, no abdominal pain. Past Medical History  Diagnosis Date  . MRSA infection    Past Surgical History  Procedure Laterality Date  . I&d extremity  04/19/2011    Procedure: IRRIGATION AND DEBRIDEMENT EXTREMITY;  Surgeon: Tami Ribas, MD;  Location: [facility];  Service: Orthopedics;  Laterality: Left;  I&D left long finger   Family History  Problem Relation Age of Onset  . Diabetes Mother   . Hypertension Mother   . Brain cancer Sister    Social History  Substance Use Topics  . Smoking status: Current Every Day Smoker -- 1.00 packs/day for 15 years    Types: Cigarettes  . Smokeless tobacco: None  . Alcohol Use: Yes     Comment: weekends    Review of Systems  Constitutional:       Per HPI, otherwise negative  HENT:       Per HPI, otherwise negative  Respiratory:       Per HPI, otherwise negative  Cardiovascular:       Per HPI, otherwise negative    Gastrointestinal: Negative for vomiting.  Endocrine:       Negative aside from HPI  Genitourinary:       Neg aside from HPI   Musculoskeletal:       Per HPI, otherwise negative  Skin: Negative.   Neurological: Negative for syncope.      Allergies  Review of patient's allergies indicates no known allergies.  Home Medications   Prior to Admission medications   Medication Sig Start Date End Date Taking? Authorizing Provider  albuterol (PROVENTIL HFA;VENTOLIN HFA) 108 (90 BASE) MCG/ACT inhaler 2 puffs q 4 h prn cough and wheeze. 03/05/13  Yes Hayden Rasmussen, NP  chlorhexidine (HIBICLENS) 4 % external liquid Apply topically daily as needed. Patient not taking: Reported on 10/13/2014 03/12/14   Ozella Rocks, MD  hyoscyamine (LEVSIN/SL) 0.125 MG SL tablet Place 1 tablet (0.125 mg total) under the tongue every 4 (four) hours as needed. For abdominal cramping Patient not taking: Reported on 10/13/2014 12/25/13   Mathis Fare Presson, PA  ketoconazole (NIZORAL) 2 % cream Apply 1 application topically daily. Patient not taking: Reported on 10/13/2014 12/20/13   Ozella Rocks, MD  mupirocin ointment (BACTROBAN) 2 % Apply 1 application topically 2 (two) times daily. Patient not taking: Reported on 10/13/2014 03/12/14   Ozella Rocks, MD  Probiotic Product (ALIGN) 4 MG CAPS Take 1 capsule (4 mg total) by mouth daily. X 10 days Patient  not taking: Reported on 10/13/2014 12/25/13   Mathis Fare Presson, PA  sulfamethoxazole-trimethoprim (BACTRIM DS,SEPTRA DS) 800-160 MG per tablet Take 1 tablet by mouth 2 (two) times daily. Patient not taking: Reported on 10/13/2014 03/12/14   Ozella Rocks, MD  terbinafine (LAMISIL) 250 MG tablet Take 1 tablet (250 mg total) by mouth daily. Patient not taking: Reported on 10/13/2014 01/04/14   Rodolph Bong, MD   BP 136/94 mmHg  Pulse 84  Temp(Src) 97.8 F (36.6 C) (Oral)  Resp 16  SpO2 95% Physical Exam  Constitutional: He is oriented to person, place, and  time. He appears well-developed. No distress.  HENT:  Head: Normocephalic and atraumatic.  Eyes: Conjunctivae and EOM are normal.  Cardiovascular: Normal rate and regular rhythm.   Pulmonary/Chest: Effort normal. No stridor. No respiratory distress.  Abdominal: He exhibits no distension.  Musculoskeletal: He exhibits no edema.       Arms: Neurological: He is alert and oriented to person, place, and time. He displays no atrophy and no tremor. No cranial nerve deficit or sensory deficit. He exhibits normal muscle tone. He displays no seizure activity. Coordination normal.  Skin: Skin is warm and dry.     Psychiatric: He has a normal mood and affect.  Nursing note and vitals reviewed.   ED Course  Procedures (including critical care time) Labs Review Labs Reviewed  URINALYSIS, ROUTINE W REFLEX MICROSCOPIC (NOT AT [facility]) - Abnormal; Notable for the following:    Hgb urine dipstick SMALL (*)    All other components within normal limits  URINE MICROSCOPIC-ADD ON    CT-scan of the abdomen / pelvis results reviewed.   On repeat exam the patient appears better.  With concern for hematuria, no prior evaluation for stone, and with concern for possible abscess, CT scan will be performed.   On exam after CT scans performed, the patient appears well, all results discussed with him and his wife.  MDM  Patient presents with ongoing low back pain. Patient does have subcutaneous lesion consistent with resolving abscess, but this seems unlikely related to his pain, as the pain is largely contralateral, with radiation down the right leg. No evidence for neurovascular compromise. Symptoms likely musculoskeletal, and the patient's description of lifting heavy objects, radiation of pain, absence of risk factors. Patient improved here, was discharged in good condition with primary care follow-up.  Gerhard Munch, MD 10/13/14 [phone]

## 2014-10-13 NOTE — ED Notes (Signed)
Dr.Lockwood at bedside  

## 2014-10-13 NOTE — ED Notes (Signed)
Pt reports lower back pain that began on Tuesday morning after he woke up. Pt reports some pain to right flank that has subsided. Also reports that he felt a "lump" on his lower back. Denies urinary symptoms, or n/v/d.

## 2014-10-13 NOTE — Discharge Instructions (Signed)
As discussed, today's evaluation is demonstrated presence of a small abscess, in your lower back.  To ensure that this improves, and to also ensure that your back pain resolves, please be sure to follow-up with your physician in one week.   Abscess An abscess is an infected area that contains a collection of pus and debris.It can occur in almost any part of the body. An abscess is also known as a furuncle or boil. CAUSES  An abscess occurs when tissue gets infected. This can occur from blockage of oil or sweat glands, infection of hair follicles, or a minor injury to the skin. As the body tries to fight the infection, pus collects in the area and creates pressure under the skin. This pressure causes pain. People with weakened immune systems have difficulty fighting infections and get certain abscesses more often.  SYMPTOMS Usually an abscess develops on the skin and becomes a painful mass that is red, warm, and tender. If the abscess forms under the skin, you may feel a moveable soft area under the skin. Some abscesses break open (rupture) on their own, but most will continue to get worse without care. The infection can spread deeper into the body and eventually into the bloodstream, causing you to feel ill.  DIAGNOSIS  Your caregiver will take your medical history and perform a physical exam. A sample of fluid may also be taken from the abscess to determine what is causing your infection. TREATMENT  Your caregiver may prescribe antibiotic medicines to fight the infection. However, taking antibiotics alone usually does not cure an abscess. Your caregiver may need to make a small cut (incision) in the abscess to drain the pus. In some cases, gauze is packed into the abscess to reduce pain and to continue draining the area. HOME CARE INSTRUCTIONS   Only take over-the-counter or prescription medicines for pain, discomfort, or fever as directed by your caregiver.  If you were prescribed antibiotics,  take them as directed. Finish them even if you start to feel better.  If gauze is used, follow your caregiver's directions for changing the gauze.  To avoid spreading the infection:  Keep your draining abscess covered with a bandage.  Wash your hands well.  Do not share personal care items, towels, or whirlpools with others.  Avoid skin contact with others.  Keep your skin and clothes clean around the abscess.  Keep all follow-up appointments as directed by your caregiver. SEEK MEDICAL CARE IF:   You have increased pain, swelling, redness, fluid drainage, or bleeding.  You have muscle aches, chills, or a general ill feeling.  You have a fever. MAKE SURE YOU:   Understand these instructions.  Will watch your condition.  Will get help right away if you are not doing well or get worse. Document Released: 11/11/2004 Document Revised: 08/03/2011 Document Reviewed: 04/16/2011 [facility] Patient Information 2015 [place], [place]. This information is not intended to replace advice given to you by your health care provider. Make sure you discuss any questions you have with your health care provider.

## 2014-10-13 NOTE — ED Notes (Signed)
Patient transported to CT 

## 2014-10-13 NOTE — ED Notes (Signed)
MD at bedside. 

## 2016-11-04 IMAGING — CT CT RENAL STONE PROTOCOL
2 of 4 series · 15 of 46 positions shown, 17 images · non-contrast
Comparison: None.

CLINICAL DATA: Right flank and low back pain, microscopic hematuria

EXAM:
CT ABDOMEN AND PELVIS WITHOUT CONTRAST
TECHNIQUE: Multidetector CT imaging of the abdomen and pelvis was performed
following the standard protocol without IV contrast.

[Series 4: coronal soft tissue · coronal · 0.85mm/px · 3 of 91 slices shown]
[im 31/91  soft-tissue]
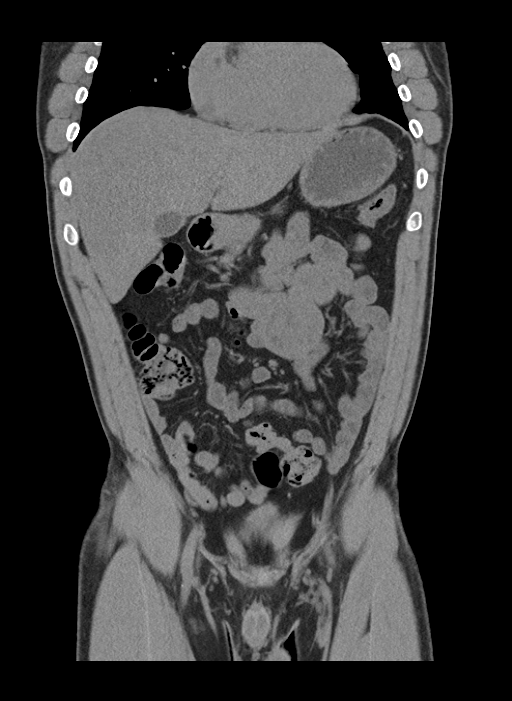
[im 41/91  soft-tissue]
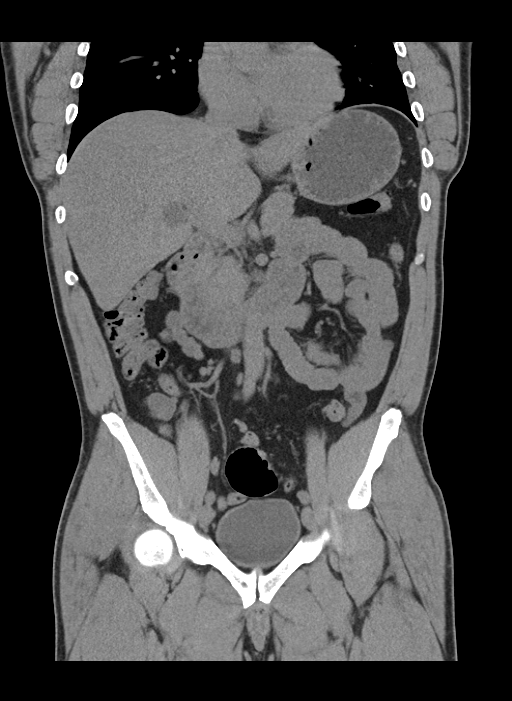
[im 51/91  soft-tissue]
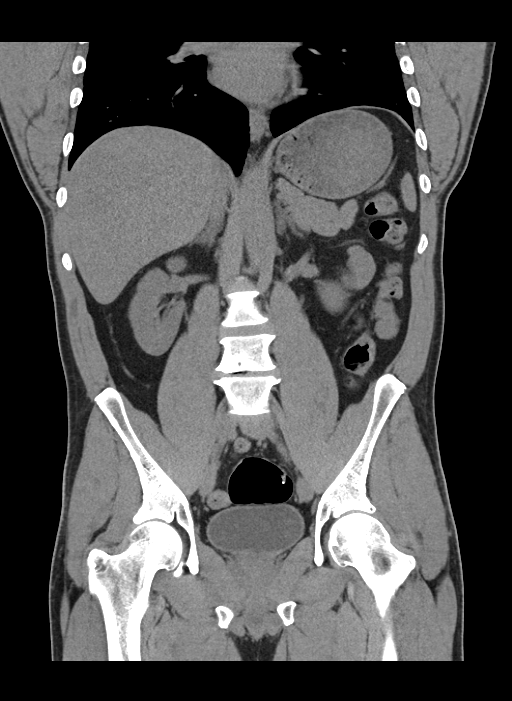

[Series 6: stone study 5.0 i30f 1 · axial · 0.78mm/px · z∈[-1131,-656]mm · 12 of 105 slices shown, 14 images]
[im 5/105  soft-tissue]
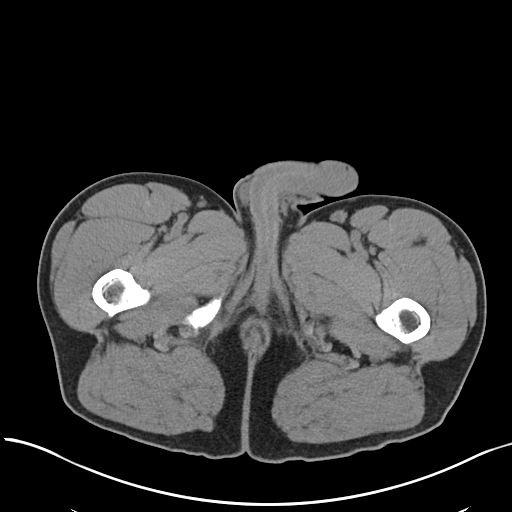
[im 5/105  bone]
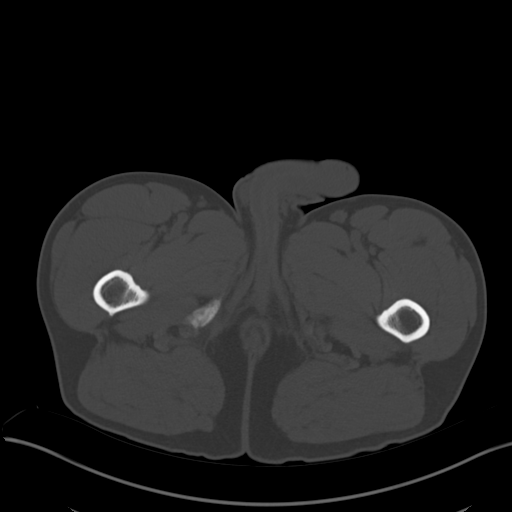
[im 14/105  soft-tissue]
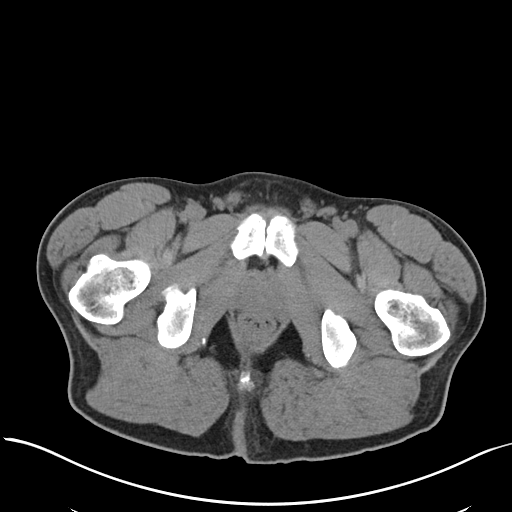
[im 22/105  soft-tissue]
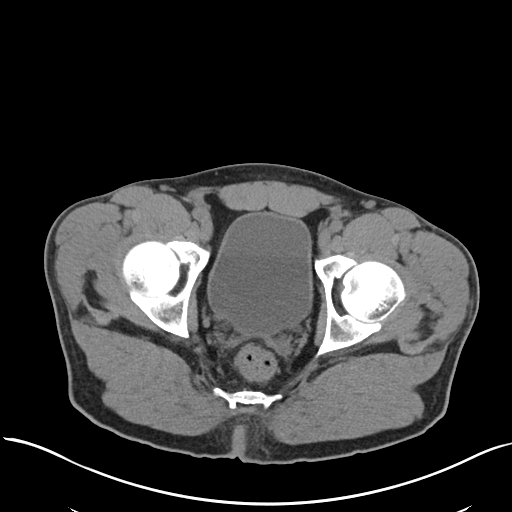
[im 31/105  soft-tissue]
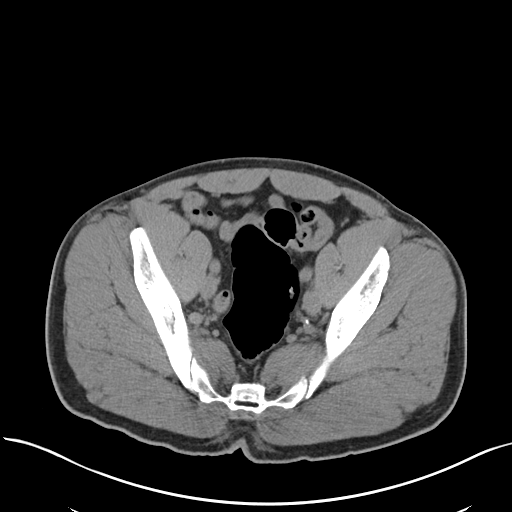
[im 40/105  soft-tissue]
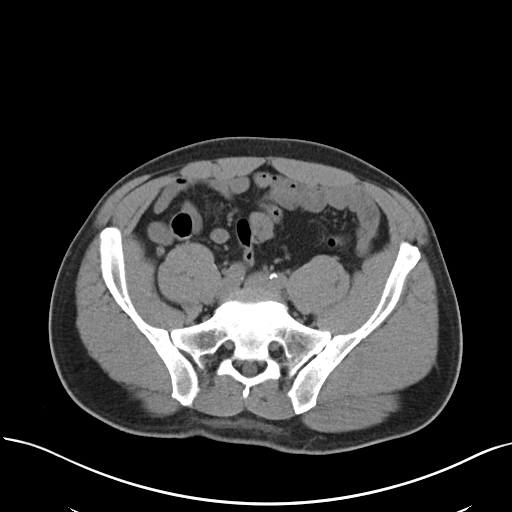
[im 48/105  soft-tissue]
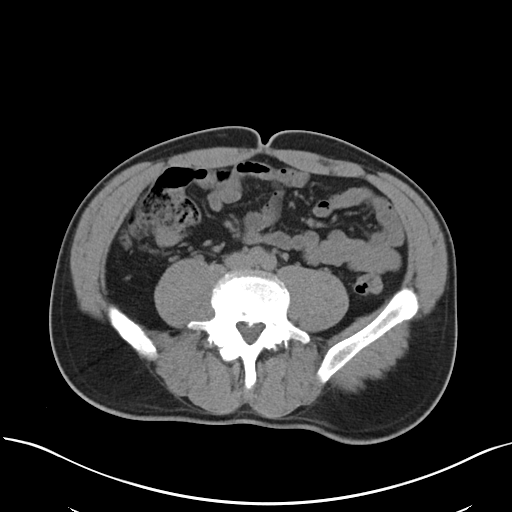
[im 57/105  soft-tissue]
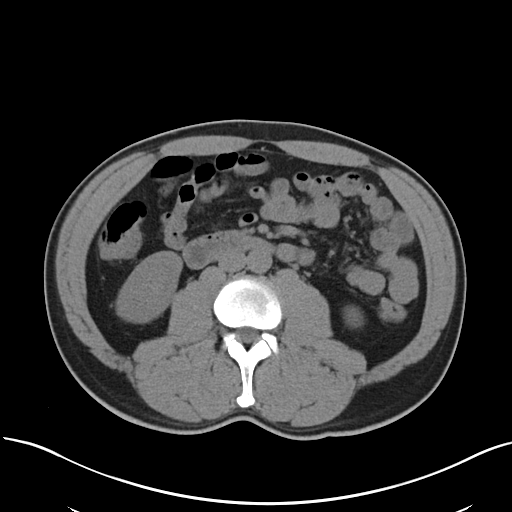
[im 66/105  soft-tissue]
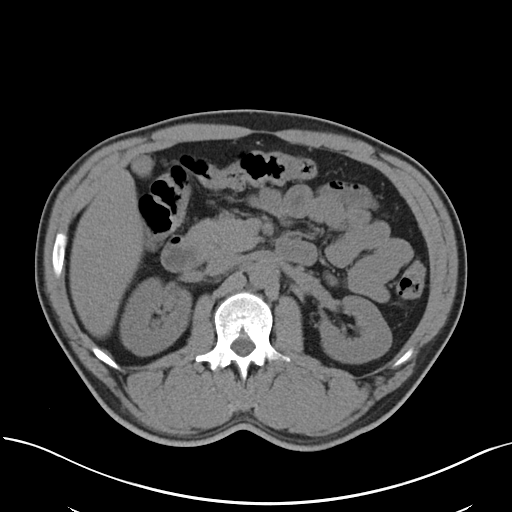
[im 74/105  soft-tissue]
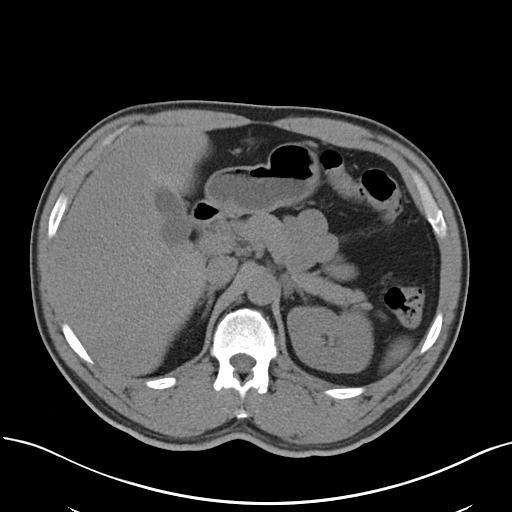
[im 74/105  bone]
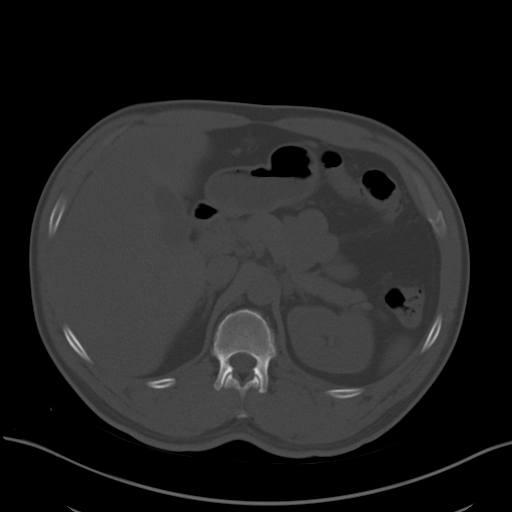
[im 83/105  soft-tissue]
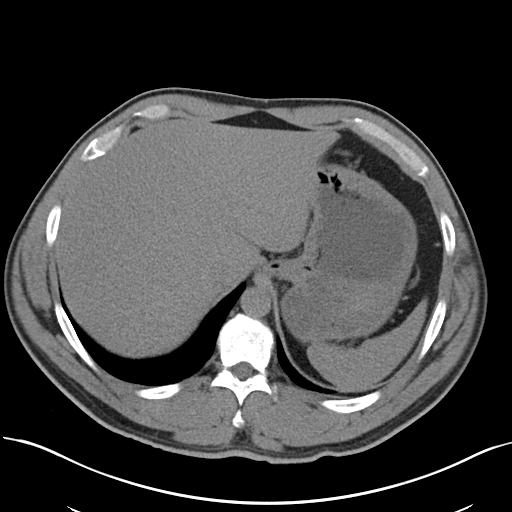
[im 92/105  soft-tissue]
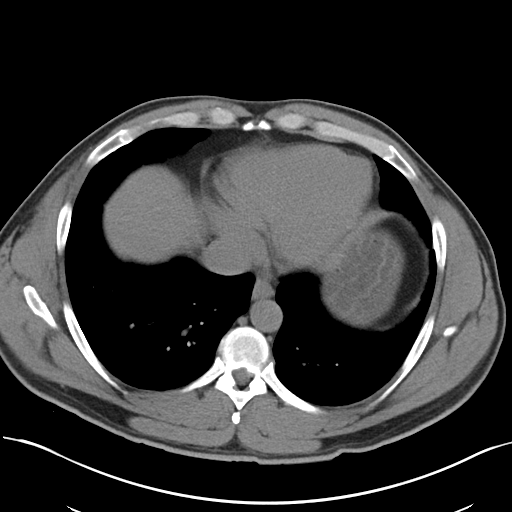
[im 100/105  soft-tissue]
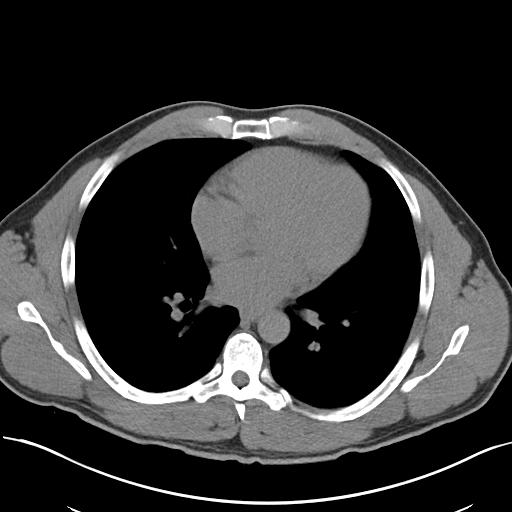

[15 of 46 positions shown; findings below may reference images not displayed]

FINDINGS: Lower chest: Minimal dependent bibasilar atelectasis. No lower lobe
pneumonia. Normal heart size. No pericardial or pleural effusion.

Abdomen: Kidneys demonstrate no acute obstruction, hydronephrosis,
perinephric edema, or obstructing ureteral calculus. Ureters are
symmetric and decompressed bilaterally.

Mild hypoattenuation of the liver, suspect fatty infiltration. No
biliary dilatation. Biliary system, pancreas, spleen, adrenal glands
within normal limits for age and noncontrast imaging.

Gallbladder fundus demonstrates circumferential wall prominence/mild
thickening, image 39. This is confirmed on the sagittal and coronal
reconstructions. This is nonspecific but suggests adenomyomatosis.
No surrounding inflammatory process. Recommend nonemergent
correlation with ultrasound.

Negative for bowel obstruction, dilatation, ileus, or free air.

No abdominal free fluid, fluid collection, hemorrhage, abscess, or
adenopathy.

Normal appendix.

Minor atherosclerosis of the aorta bifurcation and common iliac
vessels.

Pelvis: No pelvic free fluid, fluid collection, hemorrhage, abscess,
adenopathy, inguinal abnormality, or hernia.

Posterior sacral area demonstrates subcutaneous strandy edema and an
ill-defined hypodense area measuring 10 mm, image 86 along the
gluteal crease. This could represent a small area of cellulitis with
an underlying tiny subcutaneous phlegmon or early abscess.

Degenerative changes of the lumbar sacral spine, most pronounced at
L4-5 with vacuum disc phenomena. Mild facet arthropathy. Bilateral
SI joint arthropathy. No acute osseous finding or compression
fracture.
IMPRESSION: No acute obstructing urinary tract or ureteral calculus. No
hydronephrosis or obstructive uropathy on either side.

Gallbladder fundus circumferential wall thickening without
surrounding inflammation, suspect adenomyomatosis. Recommend
nonemergent ultrasound correlation.

Aortoiliac atherosclerosis without aneurysm

Normal appendix

Posterior sacral/gluteal subcutaneous strandy edema and small 10 mm
subcutaneous phlegmon or developing abscess along the gluteal
crease. Recommend correlation with exam.
